# Patient Record
Sex: Male | Born: 1955 | Race: White | Hispanic: No | Marital: Married | Smoking: Former smoker
Health system: Southern US, Community
[De-identification: ages and names within clinical notes are randomized; demographics above are authoritative.]

## PROBLEM LIST (undated history)

## (undated) DIAGNOSIS — M109 Gout, unspecified: Secondary | ICD-10-CM

## (undated) DIAGNOSIS — B019 Varicella without complication: Secondary | ICD-10-CM

## (undated) DIAGNOSIS — Z8371 Family history of colonic polyps: Secondary | ICD-10-CM

## (undated) DIAGNOSIS — J45909 Unspecified asthma, uncomplicated: Secondary | ICD-10-CM

## (undated) DIAGNOSIS — R55 Syncope and collapse: Secondary | ICD-10-CM

## (undated) DIAGNOSIS — Z83719 Family history of colon polyps, unspecified: Secondary | ICD-10-CM

## (undated) DIAGNOSIS — R9439 Abnormal result of other cardiovascular function study: Secondary | ICD-10-CM

## (undated) DIAGNOSIS — E785 Hyperlipidemia, unspecified: Secondary | ICD-10-CM

## (undated) HISTORY — PX: ORIF ANKLE FRACTURE: SUR919

## (undated) HISTORY — DX: Hyperlipidemia, unspecified: E78.5

## (undated) HISTORY — DX: Family history of colon polyps, unspecified: Z83.719

## (undated) HISTORY — PX: WISDOM TOOTH EXTRACTION: SHX21

## (undated) HISTORY — PX: SIGMOIDOSCOPY: SUR1295

## (undated) HISTORY — DX: Syncope and collapse: R55

## (undated) HISTORY — DX: Abnormal result of other cardiovascular function study: R94.39

## (undated) HISTORY — DX: Family history of colonic polyps: Z83.71

## (undated) HISTORY — DX: Unspecified asthma, uncomplicated: J45.909

## (undated) HISTORY — PX: SKIN GRAFT SPLIT THICKNESS LEG / FOOT: SUR1303

## (undated) HISTORY — PX: TONSILLECTOMY: SUR1361

## (undated) HISTORY — DX: Gout, unspecified: M10.9

## (undated) HISTORY — DX: Varicella without complication: B01.9

## (undated) HISTORY — PX: ANTERIOR CRUCIATE LIGAMENT REPAIR: SHX115

---

## 1999-08-26 ENCOUNTER — Emergency Department (HOSPITAL_COMMUNITY): Admission: EM | Admit: 1999-08-26 | Discharge: 1999-08-26 | Payer: Self-pay | Admitting: Emergency Medicine

## 2005-06-17 ENCOUNTER — Ambulatory Visit: Payer: Self-pay | Admitting: Internal Medicine

## 2005-07-29 ENCOUNTER — Ambulatory Visit: Payer: Self-pay | Admitting: Internal Medicine

## 2005-07-30 ENCOUNTER — Ambulatory Visit: Payer: Self-pay | Admitting: Internal Medicine

## 2006-11-22 ENCOUNTER — Ambulatory Visit: Payer: Self-pay | Admitting: Internal Medicine

## 2006-11-29 ENCOUNTER — Ambulatory Visit: Payer: Self-pay | Admitting: Internal Medicine

## 2007-03-01 ENCOUNTER — Encounter: Payer: Self-pay | Admitting: Internal Medicine

## 2007-04-22 ENCOUNTER — Telehealth (INDEPENDENT_AMBULATORY_CARE_PROVIDER_SITE_OTHER): Payer: Self-pay | Admitting: *Deleted

## 2007-05-27 ENCOUNTER — Ambulatory Visit: Payer: Self-pay | Admitting: Internal Medicine

## 2007-05-27 DIAGNOSIS — E782 Mixed hyperlipidemia: Secondary | ICD-10-CM | POA: Insufficient documentation

## 2007-05-27 HISTORY — DX: Mixed hyperlipidemia: E78.2

## 2007-05-27 LAB — CONVERTED CEMR LAB
Cholesterol, target level: 200 mg/dL
HDL goal, serum: 40 mg/dL
LDL Goal: 160 mg/dL

## 2007-08-01 ENCOUNTER — Ambulatory Visit: Payer: Self-pay | Admitting: Internal Medicine

## 2007-08-15 ENCOUNTER — Encounter (INDEPENDENT_AMBULATORY_CARE_PROVIDER_SITE_OTHER): Payer: Self-pay | Admitting: *Deleted

## 2007-11-24 ENCOUNTER — Ambulatory Visit: Payer: Self-pay | Admitting: Internal Medicine

## 2007-12-06 ENCOUNTER — Ambulatory Visit: Payer: Self-pay | Admitting: Internal Medicine

## 2007-12-06 DIAGNOSIS — M109 Gout, unspecified: Secondary | ICD-10-CM | POA: Insufficient documentation

## 2007-12-06 DIAGNOSIS — J45909 Unspecified asthma, uncomplicated: Secondary | ICD-10-CM

## 2007-12-06 LAB — CONVERTED CEMR LAB
AST: 21 units/L (ref 0–37)
Bilirubin, Direct: 0.2 mg/dL (ref 0.0–0.3)
Cholesterol: 202 mg/dL (ref 0–200)
HDL: 37.7 mg/dL — ABNORMAL LOW (ref >39.0)
Total Bilirubin: 1.1 mg/dL (ref 0.3–1.2)
Total CK: 88 units/L (ref 7–195)

## 2007-12-07 ENCOUNTER — Encounter (INDEPENDENT_AMBULATORY_CARE_PROVIDER_SITE_OTHER): Payer: Self-pay | Admitting: *Deleted

## 2008-01-04 ENCOUNTER — Ambulatory Visit: Payer: Self-pay | Admitting: Internal Medicine

## 2008-01-04 LAB — CONVERTED CEMR LAB
AST: 26 units/L (ref 0–37)
Alkaline Phosphatase: 55 units/L (ref 39–117)
Direct LDL: 144.7 mg/dL
Total Bilirubin: 1.4 mg/dL — ABNORMAL HIGH (ref 0.3–1.2)
Total CHOL/HDL Ratio: 6.8

## 2008-01-05 ENCOUNTER — Encounter: Payer: Self-pay | Admitting: Internal Medicine

## 2008-01-06 ENCOUNTER — Encounter (INDEPENDENT_AMBULATORY_CARE_PROVIDER_SITE_OTHER): Payer: Self-pay | Admitting: *Deleted

## 2008-02-29 ENCOUNTER — Telehealth (INDEPENDENT_AMBULATORY_CARE_PROVIDER_SITE_OTHER): Payer: Self-pay | Admitting: *Deleted

## 2008-03-01 ENCOUNTER — Telehealth (INDEPENDENT_AMBULATORY_CARE_PROVIDER_SITE_OTHER): Payer: Self-pay | Admitting: *Deleted

## 2008-05-18 ENCOUNTER — Ambulatory Visit: Payer: Self-pay | Admitting: Internal Medicine

## 2008-05-31 ENCOUNTER — Telehealth (INDEPENDENT_AMBULATORY_CARE_PROVIDER_SITE_OTHER): Payer: Self-pay | Admitting: *Deleted

## 2008-06-01 LAB — CONVERTED CEMR LAB
ALT: 30 units/L (ref 0–53)
Albumin: 4.1 g/dL (ref 3.5–5.2)
HDL: 33.9 mg/dL — ABNORMAL LOW (ref >39.0)
Total Bilirubin: 0.9 mg/dL (ref 0.3–1.2)
VLDL: 16 mg/dL (ref 0–40)

## 2008-08-07 ENCOUNTER — Ambulatory Visit: Payer: Self-pay | Admitting: Internal Medicine

## 2008-08-16 ENCOUNTER — Ambulatory Visit: Payer: Self-pay | Admitting: Internal Medicine

## 2008-08-16 LAB — CONVERTED CEMR LAB
OCCULT 1: NEGATIVE
OCCULT 2: NEGATIVE

## 2008-08-20 ENCOUNTER — Encounter (INDEPENDENT_AMBULATORY_CARE_PROVIDER_SITE_OTHER): Payer: Self-pay | Admitting: *Deleted

## 2008-08-27 ENCOUNTER — Telehealth (INDEPENDENT_AMBULATORY_CARE_PROVIDER_SITE_OTHER): Payer: Self-pay | Admitting: *Deleted

## 2009-01-23 ENCOUNTER — Ambulatory Visit: Payer: Self-pay | Admitting: Internal Medicine

## 2009-01-27 LAB — CONVERTED CEMR LAB
AST: 31 units/L (ref 0–37)
Albumin: 3.8 g/dL (ref 3.5–5.2)
Alkaline Phosphatase: 52 units/L (ref 39–117)
Bilirubin, Direct: 0.1 mg/dL (ref 0.0–0.3)
Total Bilirubin: 1.1 mg/dL (ref 0.3–1.2)
Total CHOL/HDL Ratio: 3
Triglycerides: 96 mg/dL (ref 0.0–149.0)

## 2009-01-28 ENCOUNTER — Encounter (INDEPENDENT_AMBULATORY_CARE_PROVIDER_SITE_OTHER): Payer: Self-pay | Admitting: *Deleted

## 2009-02-01 ENCOUNTER — Telehealth (INDEPENDENT_AMBULATORY_CARE_PROVIDER_SITE_OTHER): Payer: Self-pay | Admitting: *Deleted

## 2009-02-06 ENCOUNTER — Encounter: Payer: Self-pay | Admitting: Internal Medicine

## 2009-02-11 ENCOUNTER — Encounter: Payer: Self-pay | Admitting: Internal Medicine

## 2009-08-17 HISTORY — PX: COLONOSCOPY: SHX174

## 2010-03-11 ENCOUNTER — Ambulatory Visit: Payer: Self-pay | Admitting: Internal Medicine

## 2010-03-11 DIAGNOSIS — R7989 Other specified abnormal findings of blood chemistry: Secondary | ICD-10-CM | POA: Insufficient documentation

## 2010-08-17 DIAGNOSIS — R55 Syncope and collapse: Secondary | ICD-10-CM | POA: Insufficient documentation

## 2010-08-17 HISTORY — DX: Syncope and collapse: R55

## 2010-09-16 NOTE — Assessment & Plan Note (Signed)
Summary: NEEDS CRESTOR REFILL, NEEDS TO DISCUSS LABS FROM ANOTHER DOCT...   Vital Signs:  Patient profile:   55 year old male Weight:      229.2 pounds Pulse rate:   72 / minute Resp:     13 per minute BP sitting:   118 / 72  (left arm) Cuff size:   large  Vitals Entered By: Shonna Chock CMA (March 11, 2010 4:25 PM) CC: 1.) Refill Crestor and discuss lab (drawn by another doctor)  2.) Personal Concerns, Lipid Management   CC:  1.) Refill Crestor and discuss lab (drawn by another doctor)  2.) Personal Concerns and Lipid Management.  History of Present Illness: Hyperlipidemia Follow-Up      This is a 55 year old man who presents for Hyperlipidemia follow-up.  The patient reports dietary related loose  or diarrheal stools, but denies persitant  muscle aches, GI upset, abdominal pain, flushing, itching, constipation, and fatigue.  The patient denies the following symptoms: chest pain/pressure, exercise intolerance, dypsnea, palpitations, syncope, and pedal edema.  Compliance with medications (by patient report) has been near 100%.  Dietary compliance has been good.  The patient reports no exercise.  Adjunctive measures currently used by the patient include ASA.   Lipids 02/06/2010 : TC 167, LDL 100, HDL 45, & TG 112 on Crestor 20 mg once daily .   Lipid Management History:      Positive NCEP/ATP III risk factors include male age 18 years old or older.  Negative NCEP/ATP III risk factors include non-diabetic, no family history for ischemic heart disease, non-tobacco-user status, non-hypertensive, no ASHD (atherosclerotic heart disease), no prior stroke/TIA, no peripheral vascular disease, and no history of aortic aneurysm.     Current Medications (verified): 1)  Claritin 10 Mg  Tabs (Loratadine) .Marland Kitchen.. 1 Once Daily As Needed Allergies 2)  Colchicine 0.6 Mg  Tabs (Colchicine) .... Take As Directed 3)  Crestor 20 Mg Tabs (Rosuvastatin Calcium) .Marland Kitchen.. 1 Qd 4)  Singulair 10 Mg Tabs (Montelukast  Sodium) .Marland Kitchen.. 1 By Mouth Once Daily As Needed  Allergies: 1)  ! * Indomethacin  Past History:  Past Medical History: ASTHMA (ICD-493.90) HYPERLIPIDEMIA (ICD-272.4): NMR 2006: LDL 162(2555/1850), HDL 31, TG 151.LDL goal = < 90. Framingham Study LDL goal = < 160.  Family History: PGM :CVA in 76s Father: lung cancer Mother: colon polyps,lipids,?DM Siblings:  JYN:WGNFA polyp,gout;no FH Hemochromatosis   Social History: no diet , restricts red meat as to serving size Never Smoked  Review of Systems CV:  Denies difficulty breathing at night and difficulty breathing while lying down. Resp:  Denies cough, shortness of breath, sputum productive, and wheezing; rescue inhaler last used 3 months ago. MS:  Denies joint pain, joint redness, and joint swelling; Uric acid 7.4. Derm:  Complains of rash; Penile rash after hiking w/o underwear , responsive to topical antifungal. Heme:  Denies abnormal bruising and bleeding; Serum Iron 160 ( < 155), iron saturation 56 (< 55).  Physical Exam  General:  well-nourished; alert,appropriate and cooperative throughout examination Eyes:  No corneal or conjunctival inflammation noted.No icterus Neck:  No deformities, masses, or tenderness noted. Lungs:  Normal respiratory effort, chest expands symmetrically. Lungs are clear to auscultation, no crackles or wheezes. Heart:  Normal rate and regular rhythm. S1 and S2 normal without gallop, murmur, click, rub or other extra sounds. Abdomen:  Bowel sounds positive,abdomen soft and non-tender without masses, organomegaly or hernias noted. Genitalia:   No penis lesions or urethral discharge. Pulses:  R  and L carotid,radial,dorsalis pedis and posterior tibial pulses are full and equal bilaterally Extremities:  No clubbing, cyanosis, edema. Neurologic:  alert & oriented X3 and DTRs symmetrical and 0-1/2 + @ knees     Impression & Recommendations:  Problem # 1:  HYPERLIPIDEMIA (ICD-272.2) Lipids @ goal His  updated medication list for this problem includes:    Crestor 20 Mg Tabs (Rosuvastatin calcium) .Marland Kitchen... 1 qd  Problem # 2:  ASTHMA (ICD-493.90) Quiescent His updated medication list for this problem includes:    Singulair 10 Mg Tabs (Montelukast sodium) .Marland Kitchen... 1 by mouth once daily as needed    Ventolin Hfa 108 (90 Base) Mcg/act Aers (Albuterol sulfate) .Marland Kitchen... 1-2 puffs ever 4 hrs as needed  Problem # 3:  OTHER ABNORMAL BLOOD CHEMISTRY (ICD-790.6) Minimally elevated iron stores  Complete Medication List: 1)  Claritin 10 Mg Tabs (Loratadine) .Marland Kitchen.. 1 once daily as needed allergies 2)  Colchicine 0.6 Mg Tabs (Colchicine) .... Take as directed 3)  Crestor 20 Mg Tabs (Rosuvastatin calcium) .Marland Kitchen.. 1 qd 4)  Singulair 10 Mg Tabs (Montelukast sodium) .Marland Kitchen.. 1 by mouth once daily as needed 5)  Ventolin Hfa 108 (90 Base) Mcg/act Aers (Albuterol sulfate) .Marland Kitchen.. 1-2 puffs ever 4 hrs as needed  Lipid Assessment/Plan:      Based on NCEP/ATP III, the patient's risk factor category is "0-1 risk factors".  The patient's lipid goals are as follows: Total cholesterol goal is 200; LDL cholesterol goal is 160; HDL cholesterol goal is 40; Triglyceride goal is 150.  His LDL cholesterol goal has not been met.  Secondary causes for hyperlipidemia have been ruled out.  He has been counseled on adjunctive measures for lowering his cholesterol and has been provided with dietary instructions.    Patient Instructions: 1)  Avoid vitamins with iron & excess red meat. Monitor iron stores annually. Prescriptions: VENTOLIN HFA 108 (90 BASE) MCG/ACT AERS (ALBUTEROL SULFATE) 1-2 puffs ever 4 hrs as needed  #1 x 2   Entered and Authorized by:   Marga Melnick MD   Signed by:   Marga Melnick MD on 03/11/2010   Method used:   Print then Give to Patient   RxID:   [id number] SINGULAIR 10 MG TABS (MONTELUKAST SODIUM) 1 by mouth once daily as needed  #90 x 3   Entered and Authorized by:   Marga Melnick MD   Signed by:   Marga Melnick MD on 03/11/2010   Method used:   Print then Give to Patient   RxID:   [id number] CRESTOR 20 MG TABS (ROSUVASTATIN CALCIUM) 1 qd  #90 x 3   Entered and Authorized by:   Marga Melnick MD   Signed by:   Marga Melnick MD on 03/11/2010   Method used:   Print then Give to Patient   RxID:   [id number]

## 2011-05-22 ENCOUNTER — Encounter: Payer: Self-pay | Admitting: Internal Medicine

## 2011-05-25 ENCOUNTER — Other Ambulatory Visit: Payer: Self-pay | Admitting: Internal Medicine

## 2011-05-25 ENCOUNTER — Ambulatory Visit (INDEPENDENT_AMBULATORY_CARE_PROVIDER_SITE_OTHER): Payer: Managed Care, Other (non HMO) | Admitting: Internal Medicine

## 2011-05-25 ENCOUNTER — Encounter: Payer: Self-pay | Admitting: Internal Medicine

## 2011-05-25 DIAGNOSIS — E782 Mixed hyperlipidemia: Secondary | ICD-10-CM

## 2011-05-25 DIAGNOSIS — M109 Gout, unspecified: Secondary | ICD-10-CM

## 2011-05-25 DIAGNOSIS — R3129 Other microscopic hematuria: Secondary | ICD-10-CM

## 2011-05-25 DIAGNOSIS — J45909 Unspecified asthma, uncomplicated: Secondary | ICD-10-CM

## 2011-05-25 DIAGNOSIS — M791 Myalgia, unspecified site: Secondary | ICD-10-CM

## 2011-05-25 DIAGNOSIS — IMO0001 Reserved for inherently not codable concepts without codable children: Secondary | ICD-10-CM

## 2011-05-25 LAB — POCT URINALYSIS DIPSTICK
Bilirubin, UA: NEGATIVE
Glucose, UA: NEGATIVE
Ketones, UA: NEGATIVE
Leukocytes, UA: NEGATIVE
Nitrite, UA: NEGATIVE

## 2011-05-25 LAB — CK: Total CK: 75 U/L (ref 7–232)

## 2011-05-25 MED ORDER — ALLOPURINOL 100 MG PO TABS: 100.0000 mg | ORAL_TABLET | Freq: Every day | ORAL | Status: DC

## 2011-05-25 NOTE — Progress Notes (Signed)
Subjective:    Patient ID: David Melendez, male    DOB: [birth year], 55 y.o.   MRN: [id number]  HPI Asthma: Triggers:dust Cough:no; occasional wheezing Sputum production:only with dustno Dyspnea (rest/exertional/PND): Chest pain, edema, palpitations:no Treatment/efficacy:see meds Use of rescue inhaler:averages 1X/week Use of maintenance inhaler:no; Advair  irritatedthroat Smoking:quit in his 47s Past medical history: seasonal  Allergies, good control with Singulair Family history pulmonary disease: no  HYPERLIPIDEMIA: Medications: Compliance- yes , Crestor 20 mg daily  Lightheadedness,Syncope-  4 months ago he experienced syncope when he stood up to go to the bathroom on the airplane. At that time he was wearing a "hoodie" and become overheated. He was evaluated by a EMS. BP was low @ that time, but returned to normal. 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 His LDL was  82; HDL 37 and triglycerides 103. Urine revealed 0-3 red cells. Uric acid was 7.6. It has ranged from 6.3  to  this high. PSA was 2.0        Review of Systems he denies hematuria, dysuria or pyuria.  He has not had a gout attack for at least 24 months. The last was in the setting of a fractured ankle.     Objective:   Physical Exam Gen.: Healthy and well-nourished in appearance. Alert, appropriate and cooperative throughout exam. Head: Normocephalic without obvious abnormalities  Eyes: No corneal or conjunctival inflammation noted. Pupils equal round reactive to light and accommodation. Fundal exam is benign without hemorrhages, exudate, papilledema. Neck: No deformities, masses, or tenderness noted.  Thyroid normal. Lungs: Normal respiratory effort; chest expands symmetrically. Lungs are clear to auscultation without rales, wheezes, or increased work of breathing. Heart: Normal rate and rhythm. Normal S1  and S2. No gallop, click, or rub. No murmur. Abdomen: Bowel sounds normal; abdomen soft and nontender. No masses, organomegaly or hernias noted.                                                                                 Musculoskeletal/extremities: No deformity or scoliosis noted of  the thoracic or lumbar spine. No clubbing, cyanosis, edema, or deformity noted. Nail health  good. Vascular: Carotid, radial artery, dorsalis pedis and  posterior tibial pulses are full and equal. No bruits present. Neurologic: Alert and oriented x3. Deep tendon reflexes symmetrical and 0-1/2 +.          Skin: Intact without suspicious lesions or rashes. Lymph: No cervical, axillary lymphadenopathy present. Psych: Mood and affect are normal. Normally interactive                                                                                         Assessment & Plan:  #1 asthma, excellent control.  #2 dyslipidemia,  only HDL not at goal  #3 myalgias, probably work related  #4 past medical history of gout; no recent recurrences despite a uric acid of 7.6   Plan: see Orders

## 2011-05-25 NOTE — Patient Instructions (Signed)
The most common cause of elevated triglycerides is the ingestion of sugar from high fructose corn syrup sources. You should consume less than 40 grams  of sugar per day from foods and drinks with high fructose corn syrup as number 2, 3, or #4 on the label. As TG go up, HDL or good cholesterol goes down. Also uric acid which causes gout will go up.    Interventions to raise HDL or GOOD cholesterol include: exercising 30-45 minutes 3-4 X per week; including salmon & tuna in the diet;  & supplementing with Omega 3 fatty acids (Flax or Fish oil )  1-2 grams per day. The B vitamin Niacin also raises HDL but has a vasodilating effect which may cause flushing.

## 2011-05-26 ENCOUNTER — Other Ambulatory Visit: Payer: Self-pay | Admitting: Internal Medicine

## 2011-08-18 DIAGNOSIS — R9439 Abnormal result of other cardiovascular function study: Secondary | ICD-10-CM

## 2011-08-18 HISTORY — DX: Abnormal result of other cardiovascular function study: R94.39

## 2011-08-26 ENCOUNTER — Other Ambulatory Visit: Payer: Self-pay | Admitting: Internal Medicine

## 2011-12-20 ENCOUNTER — Other Ambulatory Visit: Payer: Self-pay | Admitting: Internal Medicine

## 2011-12-21 NOTE — Telephone Encounter (Signed)
Patient needs to schedule a CPX  

## 2012-01-12 DIAGNOSIS — K5790 Diverticulosis of intestine, part unspecified, without perforation or abscess without bleeding: Secondary | ICD-10-CM

## 2012-01-12 HISTORY — DX: Diverticulosis of intestine, part unspecified, without perforation or abscess without bleeding: K57.90

## 2012-02-01 DIAGNOSIS — Z860101 Personal history of adenomatous and serrated colon polyps: Secondary | ICD-10-CM

## 2012-02-01 DIAGNOSIS — Z86010 Personal history of colonic polyps: Secondary | ICD-10-CM

## 2012-02-01 HISTORY — DX: Personal history of colonic polyps: Z86.010

## 2012-02-01 HISTORY — DX: Personal history of adenomatous and serrated colon polyps: Z86.0101

## 2012-02-05 ENCOUNTER — Encounter: Payer: Self-pay | Admitting: Internal Medicine

## 2012-02-05 ENCOUNTER — Ambulatory Visit (INDEPENDENT_AMBULATORY_CARE_PROVIDER_SITE_OTHER): Payer: Managed Care, Other (non HMO) | Admitting: Internal Medicine

## 2012-02-05 VITALS — BP 128/80 | HR 84 | Wt 226.8 lb

## 2012-02-05 DIAGNOSIS — J45909 Unspecified asthma, uncomplicated: Secondary | ICD-10-CM

## 2012-02-05 DIAGNOSIS — E782 Mixed hyperlipidemia: Secondary | ICD-10-CM

## 2012-02-05 DIAGNOSIS — M109 Gout, unspecified: Secondary | ICD-10-CM

## 2012-02-05 MED ORDER — MONTELUKAST SODIUM 10 MG PO TABS: 10.0000 mg | ORAL_TABLET | Freq: Every day | ORAL | Status: DC

## 2012-02-05 MED ORDER — ROSUVASTATIN CALCIUM 20 MG PO TABS: 20.0000 mg | ORAL_TABLET | Freq: Every day | ORAL | Status: DC

## 2012-02-05 NOTE — Progress Notes (Signed)
Subjective:    Patient ID: David Melendez, male    DOB: Aug 31, 1955, 56 y.o.   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 Extensive labs were performed; these are pending. Apparently there was some abnormality on his stress test. A followup stress echo will be pursued on 02/08/12. He has not had chest pain, palpitations, claudication, or edema. There is some localized swelling related to plantar fasciitis. He states that since the stress test he has had intermittent sensation of incomplete breath.  His asthma is well controlled on generic Singulair. He uses his rescue inhaler on average once a month. The past history of asthma and discussion of  triggers is in the problem list. There is some exercise-induced bronchospasm component to his asthma as biking can initiate some symptoms.  His gout is quiescent. He states he takes colchicine and allopurinol on as-needed basis.   Dyslipidemia assessment: Prior Advanced Lipid Testing: NMR ; LDL goal = < 100, ideally.   Family history of premature CAD/ MI: P uncle in 69s .  Nutrition: no plan .  Exercise: no regular program   . Diabetes :no . HTN:no. Smoking history  : in 104s X 2 years .   Weight :  stable.     Review of Systems ROS: fatigue: not significant ;  abd pain/bowel changes: no ; myalgias:no;  syncope : last year on plane due to being overheated with hypotension ; memory loss: no;skin changes:no.        Objective:   Physical Exam Gen.: Healthy and well-nourished in appearance. Alert, appropriate and cooperative throughout exam. Head: Normocephalic without obvious abnormalities  Eyes: No corneal or conjunctival inflammation noted. Lid lag suggested. Extraocular motion intact.  Ears: External  ear exam reveals no significant lesions or deformities. Canals clear .TMs normal.  Nose: External nasal exam reveals no deformity or inflammation. Nasal mucosa are pink and moist. No lesions or exudates noted.  Septum to R Mouth: Oral mucosa and oropharynx reveal no lesions or exudates. Teeth in good repair. Neck: No deformities, masses, or tenderness noted. Thyroid normal Lungs: Normal respiratory effort; chest expands symmetrically. Lungs are clear to auscultation without rales, wheezes, or increased work of breathing. Heart: Normal rate and rhythm. Normal S1 and S2. No gallop, click, or rub.S4 w/o murmur. Abdomen: Bowel sounds normal; abdomen soft and nontender. No masses, organomegaly or hernias noted. Genitalia/DRE: colonoscopy to be scheduled                                                                            Musculoskeletal/extremities: No deformity or scoliosis noted of  the thoracic or lumbar spine; but there is some asymmetry of the posterior thoracic musculature suggesting occult scoliosis. . No clubbing, cyanosis, edema, or deformity noted. Range of motion  normal .Tone & strength  normal.Joints normal. Nail health  good. Vascular: Carotid, radial artery, dorsalis pedis and  posterior tibial pulses are full and equal. No bruits present. Neurologic: Alert and oriented x3. Deep tendon reflexes symmetrical and normal.          Skin: Intact without suspicious lesions or rashes. Lymph: No cervical, axillary lymphadenopathy present. Psych: Mood and affect are normal. Normally interactive  Assessment & Plan:

## 2012-02-05 NOTE — Assessment & Plan Note (Addendum)
His lipids and hepatic panel will need to be reviewed to allow long-term renewal of the statin. Samples will be provided until those results are can be reviewed.  He was informed glucosamine can raise cholesterol in some individuals

## 2012-02-05 NOTE — Patient Instructions (Addendum)
To prevent gout the minimal uric acid goal is < 7; preferred is < 6, ideally < 5 to prevent gout.  The most common cause of elevated uric acid is the ingestion of sugar from high fructose corn syrup sources. You should consume less than 40 grams  of sugar per day from foods and drinks with high fructose corn syrup as number 1, 2, 3, or #4 on the label. Please FAX  lab results to  [phone]

## 2012-02-05 NOTE — Assessment & Plan Note (Addendum)
By history and exam, asthma is well controlled. No change in regimen recommended. I question the possibility of a false positive stress test  due to exercise-induced bronchospasm. He should discuss this with the technician. Perhaps preprocedure albuterol may be of benefit in defining risk

## 2012-02-05 NOTE — Assessment & Plan Note (Addendum)
Gallop is quiescent. He should not take allopurinol for acute attacks. He should avoid high fructose corn syrup sugar as this is the #1 cause of uric acid elevation. Uric acid level should be monitored.  If colchicine is taken for acute gout; his Crestor should be held during that period while on colchicine

## 2012-02-08 DIAGNOSIS — Z8249 Family history of ischemic heart disease and other diseases of the circulatory system: Secondary | ICD-10-CM | POA: Insufficient documentation

## 2012-02-08 DIAGNOSIS — R9439 Abnormal result of other cardiovascular function study: Secondary | ICD-10-CM

## 2012-02-08 HISTORY — DX: Family history of ischemic heart disease and other diseases of the circulatory system: Z82.49

## 2012-02-08 HISTORY — DX: Abnormal result of other cardiovascular function study: R94.39

## 2012-04-26 ENCOUNTER — Other Ambulatory Visit: Payer: Self-pay | Admitting: Internal Medicine

## 2012-04-26 DIAGNOSIS — J45909 Unspecified asthma, uncomplicated: Secondary | ICD-10-CM

## 2012-04-26 DIAGNOSIS — E782 Mixed hyperlipidemia: Secondary | ICD-10-CM

## 2012-04-26 MED ORDER — ROSUVASTATIN CALCIUM 20 MG PO TABS: 20.0000 mg | ORAL_TABLET | Freq: Every day | ORAL | Status: DC

## 2012-04-26 MED ORDER — ALBUTEROL SULFATE HFA 108 (90 BASE) MCG/ACT IN AERS: INHALATION_SPRAY | RESPIRATORY_TRACT | Status: DC

## 2012-04-26 MED ORDER — MONTELUKAST SODIUM 10 MG PO TABS: 10.0000 mg | ORAL_TABLET | Freq: Every day | ORAL | Status: DC

## 2012-04-26 NOTE — Telephone Encounter (Signed)
Rx's sent in. °

## 2012-04-26 NOTE — Telephone Encounter (Signed)
Patient called wanting refills, states he was refused refills at his appt 6.21.13 until he brought in his labs. He stated he brought in to front desk and is now in needs of the following & is requesting a 1-yrs supply be sent to [facility] on [address]  1-crestor 2-montelukast 3-inhaler  Cb# [phone]

## 2013-03-22 ENCOUNTER — Other Ambulatory Visit: Payer: Self-pay | Admitting: Internal Medicine

## 2013-03-23 NOTE — Telephone Encounter (Signed)
Pending appointment coming up 03/2013

## 2013-04-05 ENCOUNTER — Ambulatory Visit (INDEPENDENT_AMBULATORY_CARE_PROVIDER_SITE_OTHER): Payer: Managed Care, Other (non HMO) | Admitting: Internal Medicine

## 2013-04-05 ENCOUNTER — Encounter: Payer: Self-pay | Admitting: Internal Medicine

## 2013-04-05 VITALS — BP 136/80 | HR 66 | Temp 98.3°F | Resp 12 | Ht 71.5 in | Wt 224.0 lb

## 2013-04-05 DIAGNOSIS — E559 Vitamin D deficiency, unspecified: Secondary | ICD-10-CM

## 2013-04-05 DIAGNOSIS — E782 Mixed hyperlipidemia: Secondary | ICD-10-CM

## 2013-04-05 DIAGNOSIS — Z0000 Encounter for general adult medical examination without abnormal findings: Secondary | ICD-10-CM

## 2013-04-05 DIAGNOSIS — Z1331 Encounter for screening for depression: Secondary | ICD-10-CM

## 2013-04-05 HISTORY — DX: Vitamin D deficiency, unspecified: E55.9

## 2013-04-05 MED ORDER — ROSUVASTATIN CALCIUM 20 MG PO TABS: 20.0000 mg | ORAL_TABLET | Freq: Every day | ORAL | Status: DC

## 2013-04-05 MED ORDER — MONTELUKAST SODIUM 10 MG PO TABS: ORAL_TABLET | ORAL | Status: DC

## 2013-04-05 MED ORDER — ALLOPURINOL 100 MG PO TABS: 100.0000 mg | ORAL_TABLET | Freq: Every day | ORAL | Status: DC

## 2013-04-05 NOTE — Patient Instructions (Addendum)
If you activate the  My Chart system; lab & Xray results will be released directly  to you as soon as I review & address these through the computer. If you choose not to sign up for My Chart within 36 hours of labs being drawn; results will be reviewed & interpretation added before being copied & mailed, causing a delay in getting the results to you.If you do not receive that report within 7-10 days ,please call. Additionally you can use this system to gain direct  access to your records  if  out of town or @ an office of a  physician who is not in  the My Chart network.  This improves continuity of care & places you in control of your medical record.  Please sign a release of records  for data related to  Colonoscopy findings

## 2013-04-05 NOTE — Progress Notes (Signed)
  Subjective:    Patient ID: David Melendez, male    DOB: 1956-08-03, 57 y.o.   MRN: [id number]  HPI  He  is here for a physical;acute issues denied.     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 Specifically he denies chest pain, palpitations, dyspnea, or claudication.  Family history is negative for premature coronary disease. Advanced cholesterol testing reveals his LDL goal is less than 100. There is medication compliance with the statin. Significant abdominal symptoms, memory deficit, or myalgias denied.     Objective:   Physical Exam Gen.: Healthy and well-nourished in appearance. Alert, appropriate and cooperative throughout exam.Appears younger than stated age  Head: Normocephalic without obvious abnormalities; thinning over crown  Eyes: No corneal or conjunctival inflammation noted. Pupils equal round reactive to light and accommodation.  Extraocular motion intact. Vision grossly normal without lenses Ears: External  ear exam reveals no significant lesions or deformities. Canals clear .TMs normal. Hearing is grossly normal bilaterally. Nose: External nasal exam reveals no deformity or inflammation. Nasal mucosa are pink and moist. No lesions or exudates noted. Septum deviated to R Mouth: Oral mucosa and oropharynx reveal no lesions or exudates. Teeth in good repair. Neck: No deformities, masses, or tenderness noted. Range of motion & Thyroid normal. Lungs: Normal respiratory effort; chest expands symmetrically. Lungs are clear to auscultation without rales, wheezes, or increased work of breathing. Heart: Normal rate and rhythm. Normal S1 and S2. No gallop, click, or rub. S4 w/o murmur. Abdomen: Bowel sounds normal; abdomen soft and nontender. No masses, organomegaly or hernias noted. Genitalia: Genitalia normal except for left varices. Prostate is normal without enlargement,  asymmetry, nodularity, or induration.                     Musculoskeletal/extremities: No deformity or scoliosis noted of  the thoracic or lumbar spine.  No clubbing, cyanosis, edema, or significant extremity  deformity noted. Range of motion normal .Tone & strength  Normal. Joints normal. Nail health good. Able to lie down & sit up w/o help. Negative SLR bilaterally Vascular: Carotid, radial artery, dorsalis pedis and  posterior tibial pulses are full and equal. No bruits present. Neurologic: Alert and oriented x3. Deep tendon reflexes symmetrical and normal.         Skin: Intact without suspicious lesions or rashes. Bruise R thigh Lymph: No cervical, axillary, or inguinal lymphadenopathy present. Psych: Mood and affect are normal. Normally interactive                                                                                        Assessment & Plan:  #1 comprehensive physical exam; no acute findings  Plan: see Orders  & Recommendations

## 2013-04-10 ENCOUNTER — Other Ambulatory Visit (INDEPENDENT_AMBULATORY_CARE_PROVIDER_SITE_OTHER): Payer: Managed Care, Other (non HMO)

## 2013-04-10 DIAGNOSIS — Z0000 Encounter for general adult medical examination without abnormal findings: Secondary | ICD-10-CM

## 2013-04-10 LAB — HEPATIC FUNCTION PANEL
ALT: 28 U/L (ref 0–53)
AST: 22 U/L (ref 0–37)
Bilirubin, Direct: 0 mg/dL (ref 0.0–0.3)
Total Bilirubin: 0.8 mg/dL (ref 0.3–1.2)
Total Protein: 7 g/dL (ref 6.0–8.3)

## 2013-04-10 LAB — LIPID PANEL
Cholesterol: 147 mg/dL (ref 0–200)
LDL Cholesterol: 76 mg/dL (ref 0–99)
Total CHOL/HDL Ratio: 4
Triglycerides: 148 mg/dL (ref 0.0–149.0)

## 2013-04-10 LAB — CBC WITH DIFFERENTIAL/PLATELET
Basophils Relative: 0.5 % (ref 0.0–3.0)
Eosinophils Relative: 1.2 % (ref 0.0–5.0)
HCT: 44.8 % (ref 39.0–52.0)
Lymphs Abs: 1.5 10*3/uL (ref 0.7–4.0)
MCV: 94.7 fl (ref 78.0–100.0)
Monocytes Absolute: 0.6 10*3/uL (ref 0.1–1.0)
Monocytes Relative: 11.4 % (ref 3.0–12.0)
Platelets: 237 10*3/uL (ref 150.0–400.0)
RBC: 4.73 Mil/uL (ref 4.22–5.81)
WBC: 5.6 10*3/uL (ref 4.5–10.5)

## 2013-04-10 LAB — BASIC METABOLIC PANEL
Chloride: 101 mEq/L (ref 96–112)
GFR: 93.91 mL/min (ref >60.00)
Potassium: 3.7 mEq/L (ref 3.5–5.1)
Sodium: 137 mEq/L (ref 135–145)

## 2013-04-10 LAB — URIC ACID: Uric Acid, Serum: 5 mg/dL (ref 4.0–7.8)

## 2013-04-12 ENCOUNTER — Encounter: Payer: Self-pay | Admitting: *Deleted

## 2013-04-14 ENCOUNTER — Encounter: Payer: Self-pay | Admitting: *Deleted

## 2013-04-14 LAB — VITAMIN D 1,25 DIHYDROXY
Vitamin D2 1, 25 (OH)2: <8 pg/mL
Vitamin D3 1, 25 (OH)2: 53 pg/mL

## 2013-04-23 ENCOUNTER — Encounter: Payer: Self-pay | Admitting: Internal Medicine

## 2013-04-23 DIAGNOSIS — D126 Benign neoplasm of colon, unspecified: Secondary | ICD-10-CM

## 2013-04-23 HISTORY — DX: Benign neoplasm of colon, unspecified: D12.6

## 2013-06-18 ENCOUNTER — Other Ambulatory Visit: Payer: Self-pay | Admitting: Internal Medicine

## 2013-06-19 NOTE — Telephone Encounter (Signed)
Montelukast refill sent to pharmacy 

## 2013-07-26 ENCOUNTER — Encounter: Payer: Self-pay | Admitting: Internal Medicine

## 2013-09-27 ENCOUNTER — Ambulatory Visit: Payer: Managed Care, Other (non HMO) | Admitting: Physician Assistant

## 2013-12-25 ENCOUNTER — Ambulatory Visit: Payer: Managed Care, Other (non HMO) | Admitting: Physician Assistant

## 2013-12-28 ENCOUNTER — Ambulatory Visit (INDEPENDENT_AMBULATORY_CARE_PROVIDER_SITE_OTHER): Payer: Managed Care, Other (non HMO) | Admitting: Physician Assistant

## 2013-12-28 ENCOUNTER — Encounter: Payer: Self-pay | Admitting: Physician Assistant

## 2013-12-28 VITALS — BP 124/88 | HR 67 | Temp 98.7°F | Resp 16 | Ht 71.5 in | Wt 221.2 lb

## 2013-12-28 DIAGNOSIS — M109 Gout, unspecified: Secondary | ICD-10-CM

## 2013-12-28 DIAGNOSIS — E782 Mixed hyperlipidemia: Secondary | ICD-10-CM

## 2013-12-28 DIAGNOSIS — Z0000 Encounter for general adult medical examination without abnormal findings: Secondary | ICD-10-CM

## 2013-12-28 DIAGNOSIS — Z23 Encounter for immunization: Secondary | ICD-10-CM

## 2013-12-28 DIAGNOSIS — J309 Allergic rhinitis, unspecified: Secondary | ICD-10-CM

## 2013-12-28 DIAGNOSIS — Z125 Encounter for screening for malignant neoplasm of prostate: Secondary | ICD-10-CM

## 2013-12-28 DIAGNOSIS — E785 Hyperlipidemia, unspecified: Secondary | ICD-10-CM

## 2013-12-28 HISTORY — DX: Encounter for screening for malignant neoplasm of prostate: Z12.5

## 2013-12-28 HISTORY — DX: Encounter for general adult medical examination without abnormal findings: Z00.00

## 2013-12-28 LAB — CBC WITH DIFFERENTIAL/PLATELET
Basophils Absolute: 0 10*3/uL (ref 0.0–0.1)
Basophils Relative: 1 % (ref 0–1)
EOS ABS: 0 10*3/uL (ref 0.0–0.7)
EOS PCT: 1 % (ref 0–5)
HCT: 46.2 % (ref 39.0–52.0)
Hemoglobin: 15.9 g/dL (ref 13.0–17.0)
LYMPHS ABS: 1.5 10*3/uL (ref 0.7–4.0)
Lymphocytes Relative: 34 % (ref 12–46)
MCH: 32.4 pg (ref 26.0–34.0)
MCHC: 34.4 g/dL (ref 30.0–36.0)
MCV: 94.3 fL (ref 78.0–100.0)
MONO ABS: 0.5 10*3/uL (ref 0.1–1.0)
Monocytes Relative: 12 % (ref 3–12)
Neutro Abs: 2.3 10*3/uL (ref 1.7–7.7)
Neutrophils Relative %: 52 % (ref 43–77)
PLATELETS: 228 10*3/uL (ref 150–400)
RBC: 4.9 MIL/uL (ref 4.22–5.81)
RDW: 12.7 % (ref 11.5–15.5)
WBC: 4.4 10*3/uL (ref 4.0–10.5)

## 2013-12-28 LAB — HEPATIC FUNCTION PANEL
ALBUMIN: 4.3 g/dL (ref 3.5–5.2)
ALT: 17 U/L (ref 0–53)
AST: 21 U/L (ref 0–37)
Alkaline Phosphatase: 46 U/L (ref 39–117)
BILIRUBIN TOTAL: 0.5 mg/dL (ref 0.2–1.2)
Bilirubin, Direct: 0.1 mg/dL (ref 0.0–0.3)
Indirect Bilirubin: 0.4 mg/dL (ref 0.2–1.2)
TOTAL PROTEIN: 6.5 g/dL (ref 6.0–8.3)

## 2013-12-28 LAB — BASIC METABOLIC PANEL WITH GFR
BUN: 13 mg/dL (ref 6–23)
CALCIUM: 9.5 mg/dL (ref 8.4–10.5)
CO2: 30 meq/L (ref 19–32)
CREATININE: 0.91 mg/dL (ref 0.50–1.35)
Chloride: 101 mEq/L (ref 96–112)
GFR, Est African American: >89 mL/min
GFR, Est Non African American: >89 mL/min
GLUCOSE: 89 mg/dL (ref 70–99)
Potassium: 4.7 mEq/L (ref 3.5–5.3)
Sodium: 139 mEq/L (ref 135–145)

## 2013-12-28 LAB — LIPID PANEL
Cholesterol: 135 mg/dL (ref 0–200)
HDL: 49 mg/dL (ref >39)
LDL Cholesterol: 73 mg/dL (ref 0–99)
TRIGLYCERIDES: 64 mg/dL (ref <150)
Total CHOL/HDL Ratio: 2.8 Ratio
VLDL: 13 mg/dL (ref 0–40)

## 2013-12-28 LAB — HEMOGLOBIN A1C
Hgb A1c MFr Bld: 5.6 % (ref <5.7)
Mean Plasma Glucose: 114 mg/dL (ref <117)

## 2013-12-28 MED ORDER — MONTELUKAST SODIUM 10 MG PO TABS: 10.0000 mg | ORAL_TABLET | Freq: Every day | ORAL | Status: DC

## 2013-12-28 MED ORDER — ALLOPURINOL 100 MG PO TABS: 100.0000 mg | ORAL_TABLET | Freq: Every day | ORAL | Status: DC

## 2013-12-28 MED ORDER — ROSUVASTATIN CALCIUM 20 MG PO TABS: 20.0000 mg | ORAL_TABLET | Freq: Every day | ORAL | Status: DC

## 2013-12-28 NOTE — Patient Instructions (Signed)
Please obtain labs. I will call you with your results.  Continue medications as directed.  I have sent in refills of your medications.  I have switched you back to branded Singulair.  We will schedule follow-up based on lab results.  Otherwise, return to clinic yearly for annual exam and as needed for sick visits.  Preventive Care for Adults, Male A healthy lifestyle and preventive care can promote health and wellness. Preventive health guidelines for men include the following key practices:  A routine yearly physical is a good way to check with your health care provider about your health and preventative screening. It is a chance to share any concerns and updates on your health and to receive a thorough exam.  Visit your dentist for a routine exam and preventative care every 6 months. Brush your teeth twice a day and floss once a day. Good oral hygiene prevents tooth decay and gum disease.  The frequency of eye exams is based on your age, health, family medical history, use of contact lenses, and other factors. Follow your health care provider's recommendations for frequency of eye exams.  Eat a healthy diet. Foods such as vegetables, fruits, whole grains, low-fat dairy products, and lean protein foods contain the nutrients you need without too many calories. Decrease your intake of foods high in solid fats, added sugars, and salt. Eat the right amount of calories for you.Get information about a proper diet from your health care provider, if necessary.  Regular physical exercise is one of the most important things you can do for your health. Most adults should get at least 150 minutes of moderate-intensity exercise (any activity that increases your heart rate and causes you to sweat) each week. In addition, most adults need muscle-strengthening exercises on 2 or more days a week.  Maintain a healthy weight. The body mass index (BMI) is a screening tool to identify possible weight problems. It  provides an estimate of body fat based on height and weight. Your health care provider can find your BMI and can help you achieve or maintain a healthy weight.For adults 20 years and older:  A BMI below 18.5 is considered underweight.  A BMI of 18.5 to 24.9 is normal.  A BMI of 25 to 29.9 is considered overweight.  A BMI of 30 and above is considered obese.  Maintain normal blood lipids and cholesterol levels by exercising and minimizing your intake of saturated fat. Eat a balanced diet with plenty of fruit and vegetables. Blood tests for lipids and cholesterol should begin at age 38 and be repeated every 5 years. If your lipid or cholesterol levels are high, you are over 50, or you are at high risk for heart disease, you may need your cholesterol levels checked more frequently.Ongoing high lipid and cholesterol levels should be treated with medicines if diet and exercise are not working.  If you smoke, find out from your health care provider how to quit. If you do not use tobacco, do not start.  Lung cancer screening is recommended for adults aged 78 80 years who are at high risk for developing lung cancer because of a history of smoking. A yearly low-dose CT scan of the lungs is recommended for people who have at least a 30-pack-year history of smoking and are a current smoker or have quit within the past 15 years. A pack year of smoking is smoking an average of 1 pack of cigarettes a day for 1 year (for example: 1 pack a  day for 30 years or 2 packs a day for 15 years). Yearly screening should continue until the smoker has stopped smoking for at least 15 years. Yearly screening should be stopped for people who develop a health problem that would prevent them from having lung cancer treatment.  If you choose to drink alcohol, do not have more than 2 drinks per day. One drink is considered to be 12 ounces (355 mL) of beer, 5 ounces (148 mL) of wine, or 1.5 ounces (44 mL) of liquor.  Avoid use of  street drugs. Do not share needles with anyone. Ask for help if you need support or instructions about stopping the use of drugs.  High blood pressure causes heart disease and increases the risk of stroke. Your blood pressure should be checked at least every 1 2 years. Ongoing high blood pressure should be treated with medicines, if weight loss and exercise are not effective.  If you are 38 57 years old, ask your health care provider if you should take aspirin to prevent heart disease.  Diabetes screening involves taking a blood sample to check your fasting blood sugar level. This should be done once every 3 years, after age 38, if you are within normal weight and without risk factors for diabetes. Testing should be considered at a younger age or be carried out more frequently if you are overweight and have at least 1 risk factor for diabetes.  Colorectal cancer can be detected and often prevented. Most routine colorectal cancer screening begins at the age of 31 and continues through age 46. However, your health care provider may recommend screening at an earlier age if you have risk factors for colon cancer. On a yearly basis, your health care provider may provide home test kits to check for hidden blood in the stool. Use of a small camera at the end of a tube to directly examine the colon (sigmoidoscopy or colonoscopy) can detect the earliest forms of colorectal cancer. Talk to your health care provider about this at age 14, when routine screening begins. Direct exam of the colon should be repeated every 5 10 years through age 32, unless early forms of precancerous polyps or small growths are found.  People who are at an increased risk for hepatitis B should be screened for this virus. You are considered at high risk for hepatitis B if:  You were born in a country where hepatitis B occurs often. Talk with your health care provider about which countries are considered high-risk.  Your parents were  born in a high-risk country and you have not received a shot to protect against hepatitis B (hepatitis B vaccine).  You have HIV or AIDS.  You use needles to inject street drugs.  You live with, or have sex with, someone who has hepatitis B.  You are a man who has sex with other men (MSM).  You get hemodialysis treatment.  You take certain medicines for conditions such as cancer, organ transplantation, and autoimmune conditions.  Hepatitis C blood testing is recommended for all people born from [birth year] through 1965 and any individual with known risks for hepatitis C.  Practice safe sex. Use condoms and avoid high-risk sexual practices to reduce the spread of sexually transmitted infections (STIs). STIs include gonorrhea, chlamydia, syphilis, trichomonas, herpes, HPV, and human immunodeficiency virus (HIV). Herpes, HIV, and HPV are viral illnesses that have no cure. They can result in disability, cancer, and death.  A one-time screening for abdominal aortic aneurysm (AAA)  and surgical repair of large AAAs by ultrasound are recommended for men ages 17 to 11 years who are current or former smokers.  Healthy men should no longer receive prostate-specific antigen (PSA) blood tests as part of routine cancer screening. Talk with your health care provider about prostate cancer screening.  Testicular cancer screening is not recommended for adult males who have no symptoms. Screening includes self-exam, a health care provider exam, and other screening tests. Consult with your health care provider about any symptoms you have or any concerns you have about testicular cancer.  Use sunscreen. Apply sunscreen liberally and repeatedly throughout the day. You should seek shade when your shadow is shorter than you. Protect yourself by wearing long sleeves, pants, a wide-brimmed hat, and sunglasses year round, whenever you are outdoors.  Once a month, do a whole-body skin exam, using a mirror to look at the skin  on your back. Tell your health care provider about new moles, moles that have irregular borders, moles that are larger than a pencil eraser, or moles that have changed in shape or color.  Stay current with required vaccines (immunizations).  Influenza vaccine. All adults should be immunized every year.  Tetanus, diphtheria, and acellular pertussis (Td, Tdap) vaccine. An adult who has not previously received Tdap or who does not know his vaccine status should receive 1 dose of Tdap. This initial dose should be followed by tetanus and diphtheria toxoids (Td) booster doses every 10 years. Adults with an unknown or incomplete history of completing a 3-dose immunization series with Td-containing vaccines should begin or complete a primary immunization series including a Tdap dose. Adults should receive a Td booster every 10 years.  Varicella vaccine. An adult without evidence of immunity to varicella should receive 2 doses or a second dose if he has previously received 1 dose.  Human papillomavirus (HPV) vaccine. Males aged 90 21 years who have not received the vaccine previously should receive the 3-dose series. Males aged 20 26 years may be immunized. Immunization is recommended through the age of 24 years for any male who has sex with males and did not get any or all doses earlier. Immunization is recommended for any person with an immunocompromised condition through the age of 79 years if he did not get any or all doses earlier. During the 3-dose series, the second dose should be obtained 4 8 weeks after the first dose. The third dose should be obtained 24 weeks after the first dose and 16 weeks after the second dose.  Zoster vaccine. One dose is recommended for adults aged 71 years or older unless certain conditions are present.  Measles, mumps, and rubella (MMR) vaccine. Adults born before 35 generally are considered immune to measles and mumps. Adults born in [birth year] or later should have 1 or more  doses of MMR vaccine unless there is a contraindication to the vaccine or there is laboratory evidence of immunity to each of the three diseases. A routine second dose of MMR vaccine should be obtained at least 28 days after the first dose for students attending postsecondary schools, health care workers, or international travelers. People who received inactivated measles vaccine or an unknown type of measles vaccine during 1963 1967 should receive 2 doses of MMR vaccine. People who received inactivated mumps vaccine or an unknown type of mumps vaccine before 1979 and are at high risk for mumps infection should consider immunization with 2 doses of MMR vaccine. Unvaccinated health care workers born before [birth year] who  lack laboratory evidence of measles, mumps, or rubella immunity or laboratory confirmation of disease should consider measles and mumps immunization with 2 doses of MMR vaccine or rubella immunization with 1 dose of MMR vaccine.  Pneumococcal 13-valent conjugate (PCV13) vaccine. When indicated, a person who is uncertain of his immunization history and has no record of immunization should receive the PCV13 vaccine. An adult aged 64 years or older who has certain medical conditions and has not been previously immunized should receive 1 dose of PCV13 vaccine. This PCV13 should be followed with a dose of pneumococcal polysaccharide (PPSV23) vaccine. The PPSV23 vaccine dose should be obtained at least 8 weeks after the dose of PCV13 vaccine. An adult aged 58 years or older who has certain medical conditions and previously received 1 or more doses of PPSV23 vaccine should receive 1 dose of PCV13. The PCV13 vaccine dose should be obtained 1 or more years after the last PPSV23 vaccine dose.  Pneumococcal polysaccharide (PPSV23) vaccine. When PCV13 is also indicated, PCV13 should be obtained first. All adults aged 84 years and older should be immunized. An adult younger than age 71 years who has certain medical  conditions should be immunized. Any person who resides in a nursing home or long-term care facility should be immunized. An adult smoker should be immunized. People with an immunocompromised condition and certain other conditions should receive both PCV13 and PPSV23 vaccines. People with human immunodeficiency virus (HIV) infection should be immunized as soon as possible after diagnosis. Immunization during chemotherapy or radiation therapy should be avoided. Routine use of PPSV23 vaccine is not recommended for American Indians, [place] Natives, or people younger than 65 years unless there are medical conditions that require PPSV23 vaccine. When indicated, people who have unknown immunization and have no record of immunization should receive PPSV23 vaccine. One-time revaccination 5 years after the first dose of PPSV23 is recommended for people aged 23 64 years who have chronic kidney failure, nephrotic syndrome, asplenia, or immunocompromised conditions. People who received 1 2 doses of PPSV23 before age 62 years should receive another dose of PPSV23 vaccine at age 49 years or later if at least 5 years have passed since the previous dose. Doses of PPSV23 are not needed for people immunized with PPSV23 at or after age 38 years.  Meningococcal vaccine. Adults with asplenia or persistent complement component deficiencies should receive 2 doses of quadrivalent meningococcal conjugate (MenACWY-D) vaccine. The doses should be obtained at least 2 months apart. Microbiologists working with certain meningococcal bacteria, [employer], people at risk during an outbreak, and people who travel to or live in countries with a high rate of meningitis should be immunized. A first-year college student up through age 23 years who is living in a residence hall should receive a dose if he did not receive a dose on or after his 16th birthday. Adults who have certain high-risk conditions should receive one or more doses of  vaccine.  Hepatitis A vaccine. Adults who wish to be protected from this disease, have certain high-risk conditions, work with hepatitis A-infected animals, work in hepatitis A research labs, or travel to or work in countries with a high rate of hepatitis A should be immunized. Adults who were previously unvaccinated and who anticipate close contact with an international adoptee during the first 60 days after arrival in the [place] from a country with a high rate of hepatitis A should be immunized.  Hepatitis B vaccine. Adults who wish to be protected from this disease,  have certain high-risk conditions, may be exposed to blood or other infectious body fluids, are household contacts or sex partners of hepatitis B positive people, are clients or workers in certain care facilities, or travel to or work in countries with a high rate of hepatitis B should be immunized.  Haemophilus influenzae type b (Hib) vaccine. A previously unvaccinated person with asplenia or sickle cell disease or having a scheduled splenectomy should receive 1 dose of Hib vaccine. Regardless of previous immunization, a recipient of a hematopoietic stem cell transplant should receive a 3-dose series 6 12 months after his successful transplant. Hib vaccine is not recommended for adults with HIV infection. Preventive Service / Frequency Ages 32 to 69  Blood pressure check.** / Every 1 to 2 years.  Lipid and cholesterol check.** / Every 5 years beginning at age 64.  Hepatitis C blood test.** / For any individual with known risks for hepatitis C.  Skin self-exam. / Monthly.  Influenza vaccine. / Every year.  Tetanus, diphtheria, and acellular pertussis (Tdap, Td) vaccine.** / Consult your health care provider. 1 dose of Td every 10 years.  Varicella vaccine.** / Consult your health care provider.  HPV vaccine. / 3 doses over 6 months, if 57 or younger.  Measles, mumps, rubella (MMR) vaccine.** / You need at least 1 dose  of MMR if you were born in 1957 or later. You may also need a second dose.  Pneumococcal 13-valent conjugate (PCV13) vaccine.** / Consult your health care provider.  Pneumococcal polysaccharide (PPSV23) vaccine.** / 1 to 2 doses if you smoke cigarettes or if you have certain conditions.  Meningococcal vaccine.** / 1 dose if you are age 96 to 34 years and a Market researcher living in a residence hall, or have one of several medical conditions. You may also need additional booster doses.  Hepatitis A vaccine.** / Consult your health care provider.  Hepatitis B vaccine.** / Consult your health care provider.  Haemophilus influenzae type b (Hib) vaccine.** / Consult your health care provider. Ages 58 to 34  Blood pressure check.** / Every 1 to 2 years.  Lipid and cholesterol check.** / Every 5 years beginning at age 32.  Lung cancer screening. / Every year if you are aged 9 80 years and have a 30-pack-year history of smoking and currently smoke or have quit within the past 15 years. Yearly screening is stopped once you have quit smoking for at least 15 years or develop a health problem that would prevent you from having lung cancer treatment.  Fecal occult blood test (FOBT) of stool. / Every year beginning at age 18 and continuing until age 28. You may not have to do this test if you get a colonoscopy every 10 years.  Flexible sigmoidoscopy** or colonoscopy.** / Every 5 years for a flexible sigmoidoscopy or every 10 years for a colonoscopy beginning at age 70 and continuing until age 30.  Hepatitis C blood test.** / For all people born from [birth year] through 1965 and any individual with known risks for hepatitis C.  Skin self-exam. / Monthly.  Influenza vaccine. / Every year.  Tetanus, diphtheria, and acellular pertussis (Tdap/Td) vaccine.** / Consult your health care provider. 1 dose of Td every 10 years.  Varicella vaccine.** / Consult your health care provider.  Zoster  vaccine.** / 1 dose for adults aged 80 years or older.  Measles, mumps, rubella (MMR) vaccine.** / You need at least 1 dose of MMR if you were born in 1957 or  later. You may also need a second dose.  Pneumococcal 13-valent conjugate (PCV13) vaccine.** / Consult your health care provider.  Pneumococcal polysaccharide (PPSV23) vaccine.** / 1 to 2 doses if you smoke cigarettes or if you have certain conditions.  Meningococcal vaccine.** / Consult your health care provider.  Hepatitis A vaccine.** / Consult your health care provider.  Hepatitis B vaccine.** / Consult your health care provider.  Haemophilus influenzae type b (Hib) vaccine.** / Consult your health care provider. Ages 52 and over  Blood pressure check.** / Every 1 to 2 years.  Lipid and cholesterol check.**/ Every 5 years beginning at age 62.  Lung cancer screening. / Every year if you are aged 76 80 years and have a 30-pack-year history of smoking and currently smoke or have quit within the past 15 years. Yearly screening is stopped once you have quit smoking for at least 15 years or develop a health problem that would prevent you from having lung cancer treatment.  Fecal occult blood test (FOBT) of stool. / Every year beginning at age 68 and continuing until age 5. You may not have to do this test if you get a colonoscopy every 10 years.  Flexible sigmoidoscopy** or colonoscopy.** / Every 5 years for a flexible sigmoidoscopy or every 10 years for a colonoscopy beginning at age 43 and continuing until age 57.  Hepatitis C blood test.** / For all people born from [birth year] through 1964 and any individual with known risks for hepatitis C.  Abdominal aortic aneurysm (AAA) screening.** / A one-time screening for ages 58 to 68 years who are current or former smokers.  Skin self-exam. / Monthly.  Influenza vaccine. / Every year.  Tetanus, diphtheria, and acellular pertussis (Tdap/Td) vaccine.** / 1 dose of Td every 10  years.  Varicella vaccine.** / Consult your health care provider.  Zoster vaccine.** / 1 dose for adults aged 76 years or older.  Pneumococcal 13-valent conjugate (PCV13) vaccine.** / Consult your health care provider.  Pneumococcal polysaccharide (PPSV23) vaccine.** / 1 dose for all adults aged 44 years and older.  Meningococcal vaccine.** / Consult your health care provider.  Hepatitis A vaccine.** / Consult your health care provider.  Hepatitis B vaccine.** / Consult your health care provider.  Haemophilus influenzae type b (Hib) vaccine.** / Consult your health care provider. **Family history and personal history of risk and conditions may change your health care provider's recommendations. Document Released: 09/29/2001 Document Revised: 05/24/2013 Document Reviewed: 12/29/2010 [facility] Patient Information 2014 [place], [place].

## 2013-12-28 NOTE — Assessment & Plan Note (Signed)
Continue allopurinol.  Monitor alcohol intake and intake of red meats and other purine-containing foods.

## 2013-12-28 NOTE — Progress Notes (Signed)
Patient presents to clinic today to establish care.  Acute Concerns: Needs refills of mediations  Chronic Issues: Allergic Rhinitis -- Needs refill of Singulair.  Asthma -- mild, infrequent.  Symptoms < 1 once per week.  Denies nighttime symptoms.  HAs albuterol inhaler but has not used in several months.  Gout -- On allopurinol for gout prophylaxis.  Hyperlipidemia -- currently on Crestor.  Denies myalgias/arthralgias.   Health Maintenance: Dental -- Overdue Vision -- UTD Immunizations -- Will need tetanus today.  Will check on cost of other immunizations. Colonoscopy -- 2011; due in 2016 -- benign polyps  Past Medical History  Diagnosis Date  . Asthma   . Hyperlipidemia   . Gout   . Abnormal stress test 2013    stress ECHO pending  . Syncope 2012    overheated withpostural  hypotension in flight  . Family history of colonic polyps     Mother & brother  . Chicken pox     Past Surgical History  Procedure Laterality Date  . Tonsillectomy    . Sigmoidoscopy      age 88  . Anterior cruciate ligament repair      right knee  . Colonoscopy  2011    [place] , [place]  . Wisdom tooth extraction      Current Outpatient Prescriptions on File Prior to Visit  Medication Sig Dispense Refill  . albuterol (VENTOLIN HFA) 108 (90 BASE) MCG/ACT inhaler TAKE 1 TO 2 PUFFS EVERY 4 HOURS AS NEEDED  3 Inhaler  2  . Multiple Vitamin (MULTIVITAMIN) tablet Take 1 tablet by mouth daily.         No current facility-administered medications on file prior to visit.    Allergies  Allergen Reactions  . Indomethacin     Mental status changes    Family History  Problem Relation Age of Onset  . Diabetes Mother     AODM, non IDDM  . Hyperlipidemia Mother     Living  . Colon polyps Mother   . Lung cancer Father 36    smoker; Deceased  . Colon polyps Brother   . Gout Brother   . Stroke Paternal Grandmother     in 11s  . Heart attack Paternal Uncle     in 80s  . Healthy Brother    #2  . Healthy Sister     x1    History   Social History  . Marital Status: Married    Spouse Name: N/A    Number of Children: N/A  . Years of Education: N/A   Occupational History  . Not on file.   Social History Main Topics  . Smoking status: Former Smoker    Quit date: 08/17/1980  . Smokeless tobacco: Not on file     Comment: 2 years in his 93s , < 1 ppd  . Alcohol Use: 8.4 oz/week    14 Cans of beer per week  . Drug Use: No  . Sexual Activity: Not on file   Other Topics Concern  . Not on file   Social History Narrative   Restricts red meat   Review of Systems  Constitutional: Negative for fever and weight loss.  HENT: Negative for ear discharge, ear pain, hearing loss and tinnitus.   Eyes: Negative for blurred vision, double vision, photophobia and pain.  Respiratory: Negative for cough and shortness of breath.   Cardiovascular: Negative for chest pain and palpitations.  Gastrointestinal: Negative for heartburn, nausea, vomiting, abdominal pain, diarrhea,  constipation, blood in stool and melena.  Genitourinary: Negative for dysuria, urgency, frequency, hematuria and flank pain.       Nocturia x 1  Neurological: Negative for loss of consciousness and headaches.  Endo/Heme/Allergies: Positive for environmental allergies.  Psychiatric/Behavioral: Negative for depression, suicidal ideas, hallucinations and substance abuse. The patient is not nervous/anxious.    BP 124/88  Pulse 67  Temp(Src) 98.7 F (37.1 C) (Oral)  Resp 16  Ht 5' 11.5" (1.816 m)  Wt 221 lb 4 oz (100.358 kg)  BMI 30.43 kg/m2  SpO2 99%  Physical Exam  Vitals reviewed. Constitutional: He is oriented to person, place, and time and well-developed, well-nourished, and in no distress.  HENT:  Head: Normocephalic and atraumatic.  Right Ear: External ear normal.  Left Ear: External ear normal.  Nose: Nose normal.  Mouth/Throat: Oropharynx is clear and moist. No oropharyngeal exudate.  Eyes:  Conjunctivae and EOM are normal. Pupils are equal, round, and reactive to light.  Neck: Normal range of motion. Neck supple.  Cardiovascular: Normal rate, regular rhythm, normal heart sounds and intact distal pulses.   Pulmonary/Chest: Effort normal and breath sounds normal. No respiratory distress. He has no wheezes. He has no rales. He exhibits no tenderness.  Abdominal: Soft. Bowel sounds are normal. He exhibits no distension and no mass. There is no tenderness. There is no rebound and no guarding.  Musculoskeletal: Normal range of motion.  Lymphadenopathy:    He has no cervical adenopathy.  Neurological: He is alert and oriented to person, place, and time.  Skin: Skin is warm and dry. No rash noted.  Psychiatric: Affect normal.   Assessment/Plan: HYPERLIPIDEMIA Continue current regimen.  Will obtain fasting lipid panel and hepatic function panel  GOUT Continue allopurinol.  Monitor alcohol intake and intake of red meats and other purine-containing foods.  Visit for preventive health examination Medical history reviewed and updated.  TDap given.  Patient endorses UTD on colonoscopy.  Will obtain fasting labs.  Prostate cancer screening Will obtain PSA.

## 2013-12-28 NOTE — Assessment & Plan Note (Signed)
Will obtain PSA

## 2013-12-28 NOTE — Progress Notes (Signed)
Pre visit review using our clinic review tool, if applicable. No additional management support is needed unless otherwise documented below in the visit note/SLS  

## 2013-12-28 NOTE — Assessment & Plan Note (Signed)
Continue current regimen.  Will obtain fasting lipid panel and hepatic function panel

## 2013-12-28 NOTE — Assessment & Plan Note (Addendum)
Medical history reviewed and updated.  TDap given.  Patient endorses UTD on colonoscopy.  Will obtain fasting labs.

## 2013-12-29 LAB — URINALYSIS, MICROSCOPIC ONLY
BACTERIA UA: NONE SEEN
Casts: NONE SEEN
Crystals: NONE SEEN
Squamous Epithelial / LPF: NONE SEEN

## 2013-12-29 LAB — TSH: TSH: 1.045 u[IU]/mL (ref 0.350–4.500)

## 2013-12-29 LAB — PSA: PSA: 1.89 ng/mL (ref <=4.00)

## 2014-02-23 ENCOUNTER — Other Ambulatory Visit: Payer: Self-pay | Admitting: *Deleted

## 2014-02-23 DIAGNOSIS — J45909 Unspecified asthma, uncomplicated: Secondary | ICD-10-CM

## 2014-02-23 MED ORDER — FLUTICASONE PROPIONATE 50 MCG/ACT NA SUSP: 2.0000 | Freq: Every day | NASAL | Status: DC

## 2014-02-23 MED ORDER — MONTELUKAST SODIUM 10 MG PO TABS: 10.0000 mg | ORAL_TABLET | Freq: Every day | ORAL | Status: DC

## 2014-02-23 NOTE — Progress Notes (Signed)
Per patient request and provider VO, Rx[s] sent to pharmacy; patient informed/SLS

## 2014-09-20 ENCOUNTER — Other Ambulatory Visit: Payer: Self-pay | Admitting: Physician Assistant

## 2014-12-16 ENCOUNTER — Other Ambulatory Visit: Payer: Self-pay | Admitting: Physician Assistant

## 2014-12-28 ENCOUNTER — Other Ambulatory Visit: Payer: Self-pay | Admitting: Physician Assistant

## 2015-01-14 ENCOUNTER — Other Ambulatory Visit: Payer: Self-pay | Admitting: Physician Assistant

## 2015-01-15 NOTE — Telephone Encounter (Signed)
Rx's sent to the pharmacy by e-script.//AB/CMA 

## 2015-05-06 ENCOUNTER — Encounter: Payer: Self-pay | Admitting: Physician Assistant

## 2015-05-06 ENCOUNTER — Ambulatory Visit (INDEPENDENT_AMBULATORY_CARE_PROVIDER_SITE_OTHER): Payer: Managed Care, Other (non HMO) | Admitting: Physician Assistant

## 2015-05-06 VITALS — BP 128/72 | HR 84 | Temp 98.3°F | Resp 16 | Ht 72.0 in | Wt 219.4 lb

## 2015-05-06 DIAGNOSIS — E782 Mixed hyperlipidemia: Secondary | ICD-10-CM | POA: Diagnosis not present

## 2015-05-06 MED ORDER — LORATADINE 10 MG PO TABS: 5.0000 mg | ORAL_TABLET | Freq: Every day | ORAL | Status: DC | PRN

## 2015-05-06 MED ORDER — COLCHICINE 0.6 MG PO CAPS: 0.6000 mg | ORAL_CAPSULE | ORAL | Status: DC

## 2015-05-06 MED ORDER — ALBUTEROL SULFATE HFA 108 (90 BASE) MCG/ACT IN AERS: INHALATION_SPRAY | RESPIRATORY_TRACT | Status: DC

## 2015-05-06 MED ORDER — ROSUVASTATIN CALCIUM 20 MG PO TABS: ORAL_TABLET | ORAL | Status: DC

## 2015-05-06 NOTE — Assessment & Plan Note (Signed)
Stable. Will refill branded Crestor. Patient to schedule CPE ASAP so fasting labs can be obtained.

## 2015-05-06 NOTE — Patient Instructions (Signed)
Please continue medications as directed. Stay well hydrated. Schedule an appointment for a complete physical on your way out. Come fasting to that appointment.

## 2015-05-06 NOTE — Progress Notes (Signed)
   Patient presents to clinic today for medication management for hyperlipidemia. Has not been seen in 15 months. Is taking Crestor daily as directed. Last Rx sent in was filled as a generic prescription and he states he notes some myalgias since last Rx. Is intermittent per patient.  Past Medical History  Diagnosis Date  . Asthma   . Hyperlipidemia   . Gout   . Abnormal stress test 2013    stress ECHO pending  . Syncope 2012    overheated withpostural  hypotension in flight  . Family history of colonic polyps     Mother & brother  . Chicken pox     Current Outpatient Prescriptions on File Prior to Visit  Medication Sig Dispense Refill  . allopurinol (ZYLOPRIM) 100 MG tablet TAKE 1 TABLET (100 MG TOTAL) BY MOUTH DAILY. 30 tablet 5  . montelukast (SINGULAIR) 10 MG tablet TAKE 1 TABLET (10 MG TOTAL) BY MOUTH AT BEDTIME. 30 tablet 5  . Multiple Vitamin (MULTIVITAMIN) tablet Take 1 tablet by mouth daily.       No current facility-administered medications on file prior to visit.    Allergies  Allergen Reactions  . Indomethacin     Mental status changes    Family History  Problem Relation Age of Onset  . Diabetes Mother     AODM, non IDDM  . Hyperlipidemia Mother     Living  . Colon polyps Mother   . Lung cancer Father 82    smoker; Deceased  . Colon polyps Brother   . Gout Brother   . Stroke Paternal Grandmother     in 37s  . Heart attack Paternal Uncle     in 82s  . Healthy Brother     #2  . Healthy Sister     x1    Social History   Social History  . Marital Status: Married    Spouse Name: N/A  . Number of Children: N/A  . Years of Education: N/A   Social History Main Topics  . Smoking status: Former Smoker    Quit date: 08/17/1980  . Smokeless tobacco: None     Comment: 2 years in his 61s , < 1 ppd  . Alcohol Use: 8.4 oz/week    14 Cans of beer per week  . Drug Use: No  . Sexual Activity: Not Asked   Other Topics Concern  . None   Social History  Narrative   Restricts red meat   Review of Systems - See HPI.  All other ROS are negative.  BP 128/72 mmHg  Pulse 84  Temp(Src) 98.3 F (36.8 C) (Oral)  Resp 16  Ht 6' (1.829 m)  Wt 219 lb 6 oz (99.508 kg)  BMI 29.75 kg/m2  SpO2 98%  Physical Exam  Constitutional: He is well-developed, well-nourished, and in no distress.  HENT:  Head: Normocephalic and atraumatic.  Cardiovascular: Normal rate, regular rhythm, normal heart sounds and intact distal pulses.   Pulmonary/Chest: Effort normal and breath sounds normal. No respiratory distress. He has no wheezes. He has no rales. He exhibits no tenderness.  Skin: Skin is warm and dry. No rash noted.  Psychiatric: Affect normal.  Vitals reviewed.  Assessment/Plan: HYPERLIPIDEMIA Stable. Will refill branded Crestor. Patient to schedule CPE ASAP so fasting labs can be obtained.

## 2015-05-06 NOTE — Progress Notes (Signed)
Pre visit review using our clinic review tool, if applicable. No additional management support is needed unless otherwise documented below in the visit note/SLS  

## 2015-05-07 ENCOUNTER — Encounter: Payer: Self-pay | Admitting: Physician Assistant

## 2015-05-07 ENCOUNTER — Ambulatory Visit (INDEPENDENT_AMBULATORY_CARE_PROVIDER_SITE_OTHER): Payer: Managed Care, Other (non HMO) | Admitting: Physician Assistant

## 2015-05-07 VITALS — BP 120/86 | HR 76 | Temp 98.1°F | Resp 16 | Ht 72.0 in | Wt 220.0 lb

## 2015-05-07 DIAGNOSIS — M109 Gout, unspecified: Secondary | ICD-10-CM

## 2015-05-07 DIAGNOSIS — Z0000 Encounter for general adult medical examination without abnormal findings: Secondary | ICD-10-CM

## 2015-05-07 DIAGNOSIS — E782 Mixed hyperlipidemia: Secondary | ICD-10-CM

## 2015-05-07 LAB — COMPREHENSIVE METABOLIC PANEL
ALK PHOS: 46 U/L (ref 39–117)
ALT: 19 U/L (ref 0–53)
AST: 18 U/L (ref 0–37)
Albumin: 4.2 g/dL (ref 3.5–5.2)
BUN: 13 mg/dL (ref 6–23)
CO2: 31 mEq/L (ref 19–32)
Calcium: 9.6 mg/dL (ref 8.4–10.5)
Chloride: 102 mEq/L (ref 96–112)
Creatinine, Ser: 0.93 mg/dL (ref 0.40–1.50)
GFR: 88.61 mL/min (ref >60.00)
GLUCOSE: 102 mg/dL — AB (ref 70–99)
POTASSIUM: 4.7 meq/L (ref 3.5–5.1)
Sodium: 139 mEq/L (ref 135–145)
Total Bilirubin: 0.7 mg/dL (ref 0.2–1.2)
Total Protein: 6.7 g/dL (ref 6.0–8.3)

## 2015-05-07 LAB — URINALYSIS, ROUTINE W REFLEX MICROSCOPIC
BILIRUBIN URINE: NEGATIVE
HGB URINE DIPSTICK: NEGATIVE
KETONES UR: NEGATIVE
LEUKOCYTES UA: NEGATIVE
Nitrite: NEGATIVE
Specific Gravity, Urine: 1.015 (ref 1.000–1.030)
TOTAL PROTEIN, URINE-UPE24: NEGATIVE
URINE GLUCOSE: NEGATIVE
UROBILINOGEN UA: 0.2 (ref 0.0–1.0)
WBC, UA: NONE SEEN — AB (ref 0–?)
pH: 7 (ref 5.0–8.0)

## 2015-05-07 LAB — LIPID PANEL
CHOLESTEROL: 144 mg/dL (ref 0–200)
HDL: 43.6 mg/dL (ref >39.00)
LDL Cholesterol: 80 mg/dL (ref 0–99)
NONHDL: 100.18
Total CHOL/HDL Ratio: 3
Triglycerides: 102 mg/dL (ref 0.0–149.0)
VLDL: 20.4 mg/dL (ref 0.0–40.0)

## 2015-05-07 LAB — CBC
HCT: 45.5 % (ref 39.0–52.0)
HEMOGLOBIN: 15.4 g/dL (ref 13.0–17.0)
MCHC: 33.8 g/dL (ref 30.0–36.0)
MCV: 95.4 fl (ref 78.0–100.0)
Platelets: 214 10*3/uL (ref 150.0–400.0)
RBC: 4.76 Mil/uL (ref 4.22–5.81)
RDW: 12.6 % (ref 11.5–15.5)
WBC: 4.3 10*3/uL (ref 4.0–10.5)

## 2015-05-07 LAB — TSH: TSH: 1.26 u[IU]/mL (ref 0.35–4.50)

## 2015-05-07 LAB — PSA: PSA: 2.1 ng/mL (ref 0.10–4.00)

## 2015-05-07 LAB — URIC ACID: Uric Acid, Serum: 6 mg/dL (ref 4.0–7.8)

## 2015-05-07 LAB — HEMOGLOBIN A1C: HEMOGLOBIN A1C: 5.5 % (ref 4.6–6.5)

## 2015-05-07 NOTE — Progress Notes (Signed)
Pre visit review using our clinic review tool, if applicable. No additional management support is needed unless otherwise documented below in the visit note/SLS  

## 2015-05-07 NOTE — Patient Instructions (Signed)
Please go to the lab for blood work.  I will call you with your results. If your blood work is normal we will follow-up yearly for physicals.  We will treat any abnormal findings accordingly.  Please continue medications as directed.  Preventive Care for Adults A healthy lifestyle and preventive care can promote health and wellness. Preventive health guidelines for men include the following key practices:  A routine yearly physical is a good way to check with your health care provider about your health and preventative screening. It is a chance to share any concerns and updates on your health and to receive a thorough exam.  Visit your dentist for a routine exam and preventative care every 6 months. Brush your teeth twice a day and floss once a day. Good oral hygiene prevents tooth decay and gum disease.  The frequency of eye exams is based on your age, health, family medical history, use of contact lenses, and other factors. Follow your health care provider's recommendations for frequency of eye exams.  Eat a healthy diet. Foods such as vegetables, fruits, whole grains, low-fat dairy products, and lean protein foods contain the nutrients you need without too many calories. Decrease your intake of foods high in solid fats, added sugars, and salt. Eat the right amount of calories for you.Get information about a proper diet from your health care provider, if necessary.  Regular physical exercise is one of the most important things you can do for your health. Most adults should get at least 150 minutes of moderate-intensity exercise (any activity that increases your heart rate and causes you to sweat) each week. In addition, most adults need muscle-strengthening exercises on 2 or more days a week.  Maintain a healthy weight. The body mass index (BMI) is a screening tool to identify possible weight problems. It provides an estimate of body fat based on height and weight. Your health care provider  can find your BMI and can help you achieve or maintain a healthy weight.For adults 20 years and older:  A BMI below 18.5 is considered underweight.  A BMI of 18.5 to 24.9 is normal.  A BMI of 25 to 29.9 is considered overweight.  A BMI of 30 and above is considered obese.  Maintain normal blood lipids and cholesterol levels by exercising and minimizing your intake of saturated fat. Eat a balanced diet with plenty of fruit and vegetables. Blood tests for lipids and cholesterol should begin at age 48 and be repeated every 5 years. If your lipid or cholesterol levels are high, you are over 50, or you are at high risk for heart disease, you may need your cholesterol levels checked more frequently.Ongoing high lipid and cholesterol levels should be treated with medicines if diet and exercise are not working.  If you smoke, find out from your health care provider how to quit. If you do not use tobacco, do not start.  Lung cancer screening is recommended for adults aged 63-80 years who are at high risk for developing lung cancer because of a history of smoking. A yearly low-dose CT scan of the lungs is recommended for people who have at least a 30-pack-year history of smoking and are a current smoker or have quit within the past 15 years. A pack year of smoking is smoking an average of 1 pack of cigarettes a day for 1 year (for example: 1 pack a day for 30 years or 2 packs a day for 15 years). Yearly screening should continue until  the smoker has stopped smoking for at least 15 years. Yearly screening should be stopped for people who develop a health problem that would prevent them from having lung cancer treatment.  If you choose to drink alcohol, do not have more than 2 drinks per day. One drink is considered to be 12 ounces (355 mL) of beer, 5 ounces (148 mL) of wine, or 1.5 ounces (44 mL) of liquor.  Avoid use of street drugs. Do not share needles with anyone. Ask for help if you need support or  instructions about stopping the use of drugs.  High blood pressure causes heart disease and increases the risk of stroke. Your blood pressure should be checked at least every 1-2 years. Ongoing high blood pressure should be treated with medicines, if weight loss and exercise are not effective.  If you are 51-71 years old, ask your health care provider if you should take aspirin to prevent heart disease.  Diabetes screening involves taking a blood sample to check your fasting blood sugar level. This should be done once every 3 years, after age 49, if you are within normal weight and without risk factors for diabetes. Testing should be considered at a younger age or be carried out more frequently if you are overweight and have at least 1 risk factor for diabetes.  Colorectal cancer can be detected and often prevented. Most routine colorectal cancer screening begins at the age of 34 and continues through age 61. However, your health care provider may recommend screening at an earlier age if you have risk factors for colon cancer. On a yearly basis, your health care provider may provide home test kits to check for hidden blood in the stool. Use of a small camera at the end of a tube to directly examine the colon (sigmoidoscopy or colonoscopy) can detect the earliest forms of colorectal cancer. Talk to your health care provider about this at age 48, when routine screening begins. Direct exam of the colon should be repeated every 5-10 years through age 67, unless early forms of precancerous polyps or small growths are found.  People who are at an increased risk for hepatitis B should be screened for this virus. You are considered at high risk for hepatitis B if:  You were born in a country where hepatitis B occurs often. Talk with your health care provider about which countries are considered high risk.  Your parents were born in a high-risk country and you have not received a shot to protect against  hepatitis B (hepatitis B vaccine).  You have HIV or AIDS.  You use needles to inject street drugs.  You live with, or have sex with, someone who has hepatitis B.  You are a man who has sex with other men (MSM).  You get hemodialysis treatment.  You take certain medicines for conditions such as cancer, organ transplantation, and autoimmune conditions.  Hepatitis C blood testing is recommended for all people born from [birth year] through 1965 and any individual with known risks for hepatitis C.  Practice safe sex. Use condoms and avoid high-risk sexual practices to reduce the spread of sexually transmitted infections (STIs). STIs include gonorrhea, chlamydia, syphilis, trichomonas, herpes, HPV, and human immunodeficiency virus (HIV). Herpes, HIV, and HPV are viral illnesses that have no cure. They can result in disability, cancer, and death.  If you are at risk of being infected with HIV, it is recommended that you take a prescription medicine daily to prevent HIV infection. This is called  preexposure prophylaxis (PrEP). You are considered at risk if:  You are a man who has sex with other men (MSM) and have other risk factors.  You are a heterosexual man, are sexually active, and are at increased risk for HIV infection.  You take drugs by injection.  You are sexually active with a partner who has HIV.  Talk with your health care provider about whether you are at high risk of being infected with HIV. If you choose to begin PrEP, you should first be tested for HIV. You should then be tested every 3 months for as long as you are taking PrEP.  A one-time screening for abdominal aortic aneurysm (AAA) and surgical repair of large AAAs by ultrasound are recommended for men ages 55 to 72 years who are current or former smokers.  Healthy men should no longer receive prostate-specific antigen (PSA) blood tests as part of routine cancer screening. Talk with your health care provider about prostate cancer  screening.  Testicular cancer screening is not recommended for adult males who have no symptoms. Screening includes self-exam, a health care provider exam, and other screening tests. Consult with your health care provider about any symptoms you have or any concerns you have about testicular cancer.  Use sunscreen. Apply sunscreen liberally and repeatedly throughout the day. You should seek shade when your shadow is shorter than you. Protect yourself by wearing long sleeves, pants, a wide-brimmed hat, and sunglasses year round, whenever you are outdoors.  Once a month, do a whole-body skin exam, using a mirror to look at the skin on your back. Tell your health care provider about new moles, moles that have irregular borders, moles that are larger than a pencil eraser, or moles that have changed in shape or color.  Stay current with required vaccines (immunizations).  Influenza vaccine. All adults should be immunized every year.  Tetanus, diphtheria, and acellular pertussis (Td, Tdap) vaccine. An adult who has not previously received Tdap or who does not know his vaccine status should receive 1 dose of Tdap. This initial dose should be followed by tetanus and diphtheria toxoids (Td) booster doses every 10 years. Adults with an unknown or incomplete history of completing a 3-dose immunization series with Td-containing vaccines should begin or complete a primary immunization series including a Tdap dose. Adults should receive a Td booster every 10 years.  Varicella vaccine. An adult without evidence of immunity to varicella should receive 2 doses or a second dose if he has previously received 1 dose.  Human papillomavirus (HPV) vaccine. Males aged 80-21 years who have not received the vaccine previously should receive the 3-dose series. Males aged 22-26 years may be immunized. Immunization is recommended through the age of 55 years for any male who has sex with males and did not get any or all doses  earlier. Immunization is recommended for any person with an immunocompromised condition through the age of 67 years if he did not get any or all doses earlier. During the 3-dose series, the second dose should be obtained 4-8 weeks after the first dose. The third dose should be obtained 24 weeks after the first dose and 16 weeks after the second dose.  Zoster vaccine. One dose is recommended for adults aged 41 years or older unless certain conditions are present.  Measles, mumps, and rubella (MMR) vaccine. Adults born before [birth year] generally are considered immune to measles and mumps. Adults born in [birth year] or later should have 1 or more doses of MMR  vaccine unless there is a contraindication to the vaccine or there is laboratory evidence of immunity to each of the three diseases. A routine second dose of MMR vaccine should be obtained at least 28 days after the first dose for students attending postsecondary schools, health care workers, or international travelers. People who received inactivated measles vaccine or an unknown type of measles vaccine during 1963-1967 should receive 2 doses of MMR vaccine. People who received inactivated mumps vaccine or an unknown type of mumps vaccine before 1979 and are at high risk for mumps infection should consider immunization with 2 doses of MMR vaccine. Unvaccinated health care workers born before [birth year] who lack laboratory evidence of measles, mumps, or rubella immunity or laboratory confirmation of disease should consider measles and mumps immunization with 2 doses of MMR vaccine or rubella immunization with 1 dose of MMR vaccine.  Pneumococcal 13-valent conjugate (PCV13) vaccine. When indicated, a person who is uncertain of his immunization history and has no record of immunization should receive the PCV13 vaccine. An adult aged 71 years or older who has certain medical conditions and has not been previously immunized should receive 1 dose of PCV13 vaccine. This PCV13  should be followed with a dose of pneumococcal polysaccharide (PPSV23) vaccine. The PPSV23 vaccine dose should be obtained at least 8 weeks after the dose of PCV13 vaccine. An adult aged 32 years or older who has certain medical conditions and previously received 1 or more doses of PPSV23 vaccine should receive 1 dose of PCV13. The PCV13 vaccine dose should be obtained 1 or more years after the last PPSV23 vaccine dose.  Pneumococcal polysaccharide (PPSV23) vaccine. When PCV13 is also indicated, PCV13 should be obtained first. All adults aged 70 years and older should be immunized. An adult younger than age 80 years who has certain medical conditions should be immunized. Any person who resides in a nursing home or long-term care facility should be immunized. An adult smoker should be immunized. People with an immunocompromised condition and certain other conditions should receive both PCV13 and PPSV23 vaccines. People with human immunodeficiency virus (HIV) infection should be immunized as soon as possible after diagnosis. Immunization during chemotherapy or radiation therapy should be avoided. Routine use of PPSV23 vaccine is not recommended for American Indians, [place] Natives, or people younger than 65 years unless there are medical conditions that require PPSV23 vaccine. When indicated, people who have unknown immunization and have no record of immunization should receive PPSV23 vaccine. One-time revaccination 5 years after the first dose of PPSV23 is recommended for people aged 19-64 years who have chronic kidney failure, nephrotic syndrome, asplenia, or immunocompromised conditions. People who received 1-2 doses of PPSV23 before age 37 years should receive another dose of PPSV23 vaccine at age 48 years or later if at least 5 years have passed since the previous dose. Doses of PPSV23 are not needed for people immunized with PPSV23 at or after age 40 years.  Meningococcal vaccine. Adults with asplenia or  persistent complement component deficiencies should receive 2 doses of quadrivalent meningococcal conjugate (MenACWY-D) vaccine. The doses should be obtained at least 2 months apart. Microbiologists working with certain meningococcal bacteria, [place] recruits, people at risk during an outbreak, and people who travel to or live in countries with a high rate of meningitis should be immunized. A first-year college student up through age 15 years who is living in a residence hall should receive a dose if he did not receive a dose on or after his 16th  birthday. Adults who have certain high-risk conditions should receive one or more doses of vaccine.  Hepatitis A vaccine. Adults who wish to be protected from this disease, have certain high-risk conditions, work with hepatitis A-infected animals, work in hepatitis A research labs, or travel to or work in countries with a high rate of hepatitis A should be immunized. Adults who were previously unvaccinated and who anticipate close contact with an international adoptee during the first 60 days after arrival in the [place] from a country with a high rate of hepatitis A should be immunized.  Hepatitis B vaccine. Adults should be immunized if they wish to be protected from this disease, have certain high-risk conditions, may be exposed to blood or other infectious body fluids, are household contacts or sex partners of hepatitis B positive people, are clients or workers in certain care facilities, or travel to or work in countries with a high rate of hepatitis B.  Haemophilus influenzae type b (Hib) vaccine. A previously unvaccinated person with asplenia or sickle cell disease or having a scheduled splenectomy should receive 1 dose of Hib vaccine. Regardless of previous immunization, a recipient of a hematopoietic stem cell transplant should receive a 3-dose series 6-12 months after his successful transplant. Hib vaccine is not recommended for adults with HIV  infection. Preventive Service / Frequency Ages 72 to 85  Blood pressure check.** / Every 1 to 2 years.  Lipid and cholesterol check.** / Every 5 years beginning at age 15.  Hepatitis C blood test.** / For any individual with known risks for hepatitis C.  Skin self-exam. / Monthly.  Influenza vaccine. / Every year.  Tetanus, diphtheria, and acellular pertussis (Tdap, Td) vaccine.** / Consult your health care provider. 1 dose of Td every 10 years.  Varicella vaccine.** / Consult your health care provider.  HPV vaccine. / 3 doses over 6 months, if 53 or younger.  Measles, mumps, rubella (MMR) vaccine.** / You need at least 1 dose of MMR if you were born in 1957 or later. You may also need a second dose.  Pneumococcal 13-valent conjugate (PCV13) vaccine.** / Consult your health care provider.  Pneumococcal polysaccharide (PPSV23) vaccine.** / 1 to 2 doses if you smoke cigarettes or if you have certain conditions.  Meningococcal vaccine.** / 1 dose if you are age 21 to 91 years and a Market researcher living in a residence hall, or have one of several medical conditions. You may also need additional booster doses.  Hepatitis A vaccine.** / Consult your health care provider.  Hepatitis B vaccine.** / Consult your health care provider.  Haemophilus influenzae type b (Hib) vaccine.** / Consult your health care provider. Ages 60 to 47  Blood pressure check.** / Every 1 to 2 years.  Lipid and cholesterol check.** / Every 5 years beginning at age 68.  Lung cancer screening. / Every year if you are aged 66-80 years and have a 30-pack-year history of smoking and currently smoke or have quit within the past 15 years. Yearly screening is stopped once you have quit smoking for at least 15 years or develop a health problem that would prevent you from having lung cancer treatment.  Fecal occult blood test (FOBT) of stool. / Every year beginning at age 63 and continuing until age 25.  You may not have to do this test if you get a colonoscopy every 10 years.  Flexible sigmoidoscopy** or colonoscopy.** / Every 5 years for a flexible sigmoidoscopy or every 10 years for  a colonoscopy beginning at age 6 and continuing until age 21.  Hepatitis C blood test.** / For all people born from [birth year] through 1965 and any individual with known risks for hepatitis C.  Skin self-exam. / Monthly.  Influenza vaccine. / Every year.  Tetanus, diphtheria, and acellular pertussis (Tdap/Td) vaccine.** / Consult your health care provider. 1 dose of Td every 10 years.  Varicella vaccine.** / Consult your health care provider.  Zoster vaccine.** / 1 dose for adults aged 47 years or older.  Measles, mumps, rubella (MMR) vaccine.** / You need at least 1 dose of MMR if you were born in 1956 or later. You may also need a second dose.  Pneumococcal 13-valent conjugate (PCV13) vaccine.** / Consult your health care provider.  Pneumococcal polysaccharide (PPSV23) vaccine.** / 1 to 2 doses if you smoke cigarettes or if you have certain conditions.  Meningococcal vaccine.** / Consult your health care provider.  Hepatitis A vaccine.** / Consult your health care provider.  Hepatitis B vaccine.** / Consult your health care provider.  Haemophilus influenzae type b (Hib) vaccine.** / Consult your health care provider. Ages 30 and over  Blood pressure check.** / Every 1 to 2 years.  Lipid and cholesterol check.**/ Every 5 years beginning at age 38.  Lung cancer screening. / Every year if you are aged 92-80 years and have a 30-pack-year history of smoking and currently smoke or have quit within the past 15 years. Yearly screening is stopped once you have quit smoking for at least 15 years or develop a health problem that would prevent you from having lung cancer treatment.  Fecal occult blood test (FOBT) of stool. / Every year beginning at age 43 and continuing until age 2. You may not have to do this  test if you get a colonoscopy every 10 years.  Flexible sigmoidoscopy** or colonoscopy.** / Every 5 years for a flexible sigmoidoscopy or every 10 years for a colonoscopy beginning at age 78 and continuing until age 78.  Hepatitis C blood test.** / For all people born from [birth year] through 1964 and any individual with known risks for hepatitis C.  Abdominal aortic aneurysm (AAA) screening.** / A one-time screening for ages 42 to 28 years who are current or former smokers.  Skin self-exam. / Monthly.  Influenza vaccine. / Every year.  Tetanus, diphtheria, and acellular pertussis (Tdap/Td) vaccine.** / 1 dose of Td every 10 years.  Varicella vaccine.** / Consult your health care provider.  Zoster vaccine.** / 1 dose for adults aged 5 years or older.  Pneumococcal 13-valent conjugate (PCV13) vaccine.** / Consult your health care provider.  Pneumococcal polysaccharide (PPSV23) vaccine.** / 1 dose for all adults aged 8 years and older.  Meningococcal vaccine.** / Consult your health care provider.  Hepatitis A vaccine.** / Consult your health care provider.  Hepatitis B vaccine.** / Consult your health care provider.  Haemophilus influenzae type b (Hib) vaccine.** / Consult your health care provider. **Family history and personal history of risk and conditions may change your health care provider's recommendations. Document Released: 09/29/2001 Document Revised: 08/08/2013 Document Reviewed: 12/29/2010 [facility] Patient Information 2015 [place], [place]. This information is not intended to replace advice given to you by your health care provider. Make sure you discuss any questions you have with your health care provider.

## 2015-05-07 NOTE — Progress Notes (Signed)
Patient presents to clinic today for annual exam.  Patient is fasting for labs.  Chronic Issues: Hyperlipidemia -- Patient tolerating Crestor daily without significant myalgias with new QOD dosing.  Seasonal Allergies -- Well controlled with daily Claritin and Singulair.  Gout -- Currently on Allopurinol for prophylaxis. Colchicine at home for acute flares. Is watching diet. Needs repeat uric acid level.  Health Maintenance: Dental -- up-to-date Vision -- up-to-date Immunizations -- up-to-date on Tetanus. Declines flu shot. Colonoscopy -- up-to-date  Past Medical History  Diagnosis Date  . Asthma   . Hyperlipidemia   . Gout   . Abnormal stress test 2013    stress ECHO pending  . Syncope 2012    overheated withpostural  hypotension in flight  . Family history of colonic polyps     Mother & brother  . Chicken pox     Past Surgical History  Procedure Laterality Date  . Tonsillectomy    . Sigmoidoscopy      age 48  . Anterior cruciate ligament repair      right knee  . Colonoscopy  2011    [place] , [place]  . Wisdom tooth extraction      Current Outpatient Prescriptions on File Prior to Visit  Medication Sig Dispense Refill  . albuterol (VENTOLIN HFA) 108 (90 BASE) MCG/ACT inhaler TAKE 1 TO 2 PUFFS EVERY 4 HOURS AS NEEDED 1 Inhaler 1  . allopurinol (ZYLOPRIM) 100 MG tablet TAKE 1 TABLET (100 MG TOTAL) BY MOUTH DAILY. 30 tablet 5  . Colchicine 0.6 MG CAPS Take 0.6 mg by mouth as directed. 30 capsule 1  . loratadine (CLARITIN) 10 MG tablet Take 0.5-1 tablets (5-10 mg total) by mouth daily as needed for allergies. 30 tablet 2  . montelukast (SINGULAIR) 10 MG tablet TAKE 1 TABLET (10 MG TOTAL) BY MOUTH AT BEDTIME. 30 tablet 5  . Multiple Vitamin (MULTIVITAMIN) tablet Take 1 tablet by mouth daily.      . rosuvastatin (CRESTOR) 20 MG tablet TAKE 1 TABLET (20 MG TOTAL) BY MOUTH DAILY. 90 tablet 0   No current facility-administered medications on file prior to visit.     Allergies  Allergen Reactions  . Indomethacin     Mental status changes    Family History  Problem Relation Age of Onset  . Diabetes Mother     AODM, non IDDM  . Hyperlipidemia Mother     Living  . Colon polyps Mother   . Lung cancer Father 5    smoker; Deceased  . Colon polyps Brother   . Gout Brother   . Stroke Paternal Grandmother     in 21s  . Heart attack Paternal Uncle     in 78s  . Healthy Brother     #2  . Healthy Sister     x1    Social History   Social History  . Marital Status: Married    Spouse Name: N/A  . Number of Children: N/A  . Years of Education: N/A   Occupational History  . Not on file.   Social History Main Topics  . Smoking status: Former Smoker    Quit date: 08/17/1980  . Smokeless tobacco: Not on file     Comment: 2 years in his 52s , < 1 ppd  . Alcohol Use: 8.4 oz/week    14 Cans of beer per week  . Drug Use: No  . Sexual Activity: Not on file   Other Topics Concern  .  Not on file   Social History Narrative   Restricts red meat   Review of Systems  Constitutional: Negative for fever and weight loss.  HENT: Negative for ear discharge, ear pain, hearing loss and tinnitus.   Eyes: Negative for blurred vision, double vision, photophobia and pain.  Respiratory: Negative for cough and shortness of breath.   Cardiovascular: Negative for chest pain and palpitations.  Gastrointestinal: Negative for heartburn, nausea, vomiting, abdominal pain, diarrhea, constipation, blood in stool and melena.  Genitourinary: Negative for dysuria, urgency, frequency, hematuria and flank pain.  Musculoskeletal: Negative for falls.  Neurological: Negative for dizziness, loss of consciousness and headaches.  Endo/Heme/Allergies: Negative for environmental allergies.  Psychiatric/Behavioral: Negative for depression, suicidal ideas, hallucinations and substance abuse. The patient is not nervous/anxious and does not have insomnia.     BP 120/86 mmHg   Pulse 76  Temp(Src) 98.1 F (36.7 C) (Oral)  Resp 16  Ht 6' (1.829 m)  Wt 220 lb (99.791 kg)  BMI 29.83 kg/m2  SpO2 98%  Physical Exam  Constitutional: He is oriented to person, place, and time and well-developed, well-nourished, and in no distress.  HENT:  Head: Normocephalic and atraumatic.  Right Ear: External ear normal.  Left Ear: External ear normal.  Nose: Nose normal.  Mouth/Throat: Oropharynx is clear and moist. No oropharyngeal exudate.  Eyes: Conjunctivae and EOM are normal. Pupils are equal, round, and reactive to light.  Neck: Neck supple. No thyromegaly present.  Cardiovascular: Normal rate, regular rhythm, normal heart sounds and intact distal pulses.   Pulmonary/Chest: Effort normal and breath sounds normal. No respiratory distress. He has no wheezes. He has no rales. He exhibits no tenderness.  Abdominal: Soft. Bowel sounds are normal. He exhibits no distension and no mass. There is no tenderness. There is no rebound and no guarding.  Genitourinary: Testes/scrotum normal.  Lymphadenopathy:    He has no cervical adenopathy.  Neurological: He is alert and oriented to person, place, and time.  Skin: Skin is warm and dry. No rash noted.  Psychiatric: Affect normal.  Vitals reviewed.   Recent Results (from the past 2160 hour(s))  CBC     Status: None   Collection Time: 05/07/15 10:26 AM  Result Value Ref Range   WBC 4.3 4.0 - 10.5 K/uL   RBC 4.76 4.22 - 5.81 Mil/uL   Platelets 214.0 150.0 - 400.0 K/uL   Hemoglobin 15.4 13.0 - 17.0 g/dL   HCT 45.5 39.0 - 52.0 %   MCV 95.4 78.0 - 100.0 fl   MCHC 33.8 30.0 - 36.0 g/dL   RDW 12.6 11.5 - 15.5 %  Comp Met (CMET)     Status: Abnormal   Collection Time: 05/07/15 10:26 AM  Result Value Ref Range   Sodium 139 135 - 145 mEq/L   Potassium 4.7 3.5 - 5.1 mEq/L   Chloride 102 96 - 112 mEq/L   CO2 31 19 - 32 mEq/L   Glucose, Bld 102 (H) 70 - 99 mg/dL   BUN 13 6 - 23 mg/dL   Creatinine, Ser 0.93 0.40 - 1.50 mg/dL    Total Bilirubin 0.7 0.2 - 1.2 mg/dL   Alkaline Phosphatase 46 39 - 117 U/L   AST 18 0 - 37 U/L   ALT 19 0 - 53 U/L   Total Protein 6.7 6.0 - 8.3 g/dL   Albumin 4.2 3.5 - 5.2 g/dL   Calcium 9.6 8.4 - 10.5 mg/dL   GFR 88.61 >60.00 mL/min  TSH  Status: None   Collection Time: 05/07/15 10:26 AM  Result Value Ref Range   TSH 1.26 0.35 - 4.50 uIU/mL  Urinalysis, Routine w reflex microscopic (not at [facility])     Status: Abnormal   Collection Time: 05/07/15 10:26 AM  Result Value Ref Range   Color, Urine YELLOW Yellow;Lt. Yellow   APPearance CLEAR Clear   Specific Gravity, Urine 1.015 1.000-1.030   pH 7.0 5.0 - 8.0   Total Protein, Urine NEGATIVE Negative   Urine Glucose NEGATIVE Negative   Ketones, ur NEGATIVE Negative   Bilirubin Urine NEGATIVE Negative   Hgb urine dipstick NEGATIVE Negative   Urobilinogen, UA 0.2 0.0 - 1.0   Leukocytes, UA NEGATIVE Negative   Nitrite NEGATIVE Negative   WBC, UA No formed elements seen on urine microscopic exam. (A) 0-2/hpf  Lipid Profile     Status: None   Collection Time: 05/07/15 10:26 AM  Result Value Ref Range   Cholesterol 144 0 - 200 mg/dL    Comment: ATP III Classification       Desirable:  < 200 mg/dL               Borderline High:  200 - 239 mg/dL          High:  > = 240 mg/dL   Triglycerides 102.0 0.0 - 149.0 mg/dL    Comment: Normal:  <150 mg/dLBorderline High:  150 - 199 mg/dL   HDL 43.60 >39.00 mg/dL   VLDL 20.4 0.0 - 40.0 mg/dL   LDL Cholesterol 80 0 - 99 mg/dL   Total CHOL/HDL Ratio 3     Comment:                Men          Women1/2 Average Risk     3.4          3.3Average Risk          5.0          4.42X Average Risk          9.6          7.13X Average Risk          15.0          11.0                       NonHDL 100.18     Comment: NOTE:  Non-HDL goal should be 30 mg/dL higher than patient's LDL goal (i.e. LDL goal of < 70 mg/dL, would have non-HDL goal of < 100 mg/dL)  Hemoglobin A1c     Status: None   Collection Time:  05/07/15 10:26 AM  Result Value Ref Range   Hgb A1c MFr Bld 5.5 4.6 - 6.5 %    Comment: Glycemic Control Guidelines for People with Diabetes:Non Diabetic:  <6%Goal of Therapy: <7%Additional Action Suggested:  >8%   PSA     Status: None   Collection Time: 05/07/15 10:26 AM  Result Value Ref Range   PSA 2.10 0.10 - 4.00 ng/mL  Uric acid     Status: None   Collection Time: 05/07/15 10:26 AM  Result Value Ref Range   Uric Acid, Serum 6.0 4.0 - 7.8 mg/dL    Assessment/Plan: Visit for preventive health examination Depression screen negative. Health Maintenance reviewed and up-to-date. Preventive schedule discussed and handout given in AVS. Will obtain fasting labs today.   HYPERLIPIDEMIA Will repeat lipid panel today. Continue Crestor as directed.  Gout No recent flares. Will repeat Uric acid level

## 2015-05-08 ENCOUNTER — Encounter: Payer: Self-pay | Admitting: *Deleted

## 2015-05-08 NOTE — Assessment & Plan Note (Signed)
No recent flares. Will repeat Uric acid level

## 2015-05-08 NOTE — Assessment & Plan Note (Signed)
Will repeat lipid panel today. Continue Crestor as directed.

## 2015-05-08 NOTE — Assessment & Plan Note (Signed)
Depression screen negative. Health Maintenance reviewed and up-to-date. Preventive schedule discussed and handout given in AVS. Will obtain fasting labs today.

## 2015-06-19 ENCOUNTER — Other Ambulatory Visit: Payer: Self-pay | Admitting: Physician Assistant

## 2015-06-27 ENCOUNTER — Other Ambulatory Visit: Payer: Self-pay | Admitting: Physician Assistant

## 2015-06-27 NOTE — Telephone Encounter (Signed)
Last filled: 01/15/15 Amt: 30, 5 Last OV:  05/07/15  Med filled.

## 2015-12-15 ENCOUNTER — Other Ambulatory Visit: Payer: Self-pay | Admitting: Physician Assistant

## 2015-12-16 ENCOUNTER — Other Ambulatory Visit: Payer: Self-pay | Admitting: Physician Assistant

## 2015-12-16 NOTE — Telephone Encounter (Signed)
Rx request to pharmacy/SLS  

## 2016-03-10 ENCOUNTER — Other Ambulatory Visit: Payer: Self-pay | Admitting: Physician Assistant

## 2016-04-05 ENCOUNTER — Other Ambulatory Visit: Payer: Self-pay | Admitting: Physician Assistant

## 2016-04-06 NOTE — Telephone Encounter (Signed)
Rx request to pharmacy/SLS Requested drug refills are authorized, however, the patient needs further evaluation and/or laboratory testing before further refills are given. Ask him to make an appointment for this.  Please have patient schedule complete Physical with fasting labs, due after 04/2016, per provider/SLS 0/21 Thanks.

## 2016-04-08 NOTE — Telephone Encounter (Signed)
Left message for patient to call and schedule appointment for fasting CPE.

## 2016-04-27 ENCOUNTER — Other Ambulatory Visit: Payer: Self-pay | Admitting: Physician Assistant

## 2016-05-12 ENCOUNTER — Encounter: Payer: Managed Care, Other (non HMO) | Admitting: Physician Assistant

## 2016-05-15 ENCOUNTER — Encounter: Payer: Self-pay | Admitting: Physician Assistant

## 2016-05-15 ENCOUNTER — Ambulatory Visit (INDEPENDENT_AMBULATORY_CARE_PROVIDER_SITE_OTHER): Payer: Managed Care, Other (non HMO) | Admitting: Physician Assistant

## 2016-05-15 VITALS — BP 110/82 | HR 64 | Temp 98.4°F | Resp 16 | Ht 72.0 in | Wt 219.1 lb

## 2016-05-15 DIAGNOSIS — B070 Plantar wart: Secondary | ICD-10-CM

## 2016-05-15 DIAGNOSIS — J4520 Mild intermittent asthma, uncomplicated: Secondary | ICD-10-CM | POA: Diagnosis not present

## 2016-05-15 DIAGNOSIS — M109 Gout, unspecified: Secondary | ICD-10-CM

## 2016-05-15 DIAGNOSIS — E782 Mixed hyperlipidemia: Secondary | ICD-10-CM | POA: Diagnosis not present

## 2016-05-15 DIAGNOSIS — Z0000 Encounter for general adult medical examination without abnormal findings: Secondary | ICD-10-CM

## 2016-05-15 DIAGNOSIS — Z0001 Encounter for general adult medical examination with abnormal findings: Secondary | ICD-10-CM

## 2016-05-15 LAB — COMPREHENSIVE METABOLIC PANEL
ALK PHOS: 44 U/L (ref 39–117)
ALT: 17 U/L (ref 0–53)
AST: 15 U/L (ref 0–37)
Albumin: 3.9 g/dL (ref 3.5–5.2)
BILIRUBIN TOTAL: 0.8 mg/dL (ref 0.2–1.2)
BUN: 16 mg/dL (ref 6–23)
CO2: 33 meq/L — AB (ref 19–32)
CREATININE: 0.94 mg/dL (ref 0.40–1.50)
Calcium: 9.4 mg/dL (ref 8.4–10.5)
Chloride: 104 mEq/L (ref 96–112)
GFR: 87.22 mL/min (ref >60.00)
GLUCOSE: 91 mg/dL (ref 70–99)
Potassium: 4.8 mEq/L (ref 3.5–5.1)
SODIUM: 140 meq/L (ref 135–145)
TOTAL PROTEIN: 6.6 g/dL (ref 6.0–8.3)

## 2016-05-15 LAB — URINALYSIS, ROUTINE W REFLEX MICROSCOPIC
Bilirubin Urine: NEGATIVE
HGB URINE DIPSTICK: NEGATIVE
Ketones, ur: NEGATIVE
LEUKOCYTES UA: NEGATIVE
NITRITE: NEGATIVE
PH: 5.5 (ref 5.0–8.0)
Specific Gravity, Urine: 1.025 (ref 1.000–1.030)
TOTAL PROTEIN, URINE-UPE24: NEGATIVE
URINE GLUCOSE: NEGATIVE
Urobilinogen, UA: 0.2 (ref 0.0–1.0)

## 2016-05-15 LAB — TSH: TSH: 1.46 u[IU]/mL (ref 0.35–4.50)

## 2016-05-15 LAB — CBC
HCT: 47.3 % (ref 39.0–52.0)
Hemoglobin: 16.2 g/dL (ref 13.0–17.0)
MCHC: 34.2 g/dL (ref 30.0–36.0)
MCV: 95 fl (ref 78.0–100.0)
Platelets: 231 10*3/uL (ref 150.0–400.0)
RBC: 4.97 Mil/uL (ref 4.22–5.81)
RDW: 13 % (ref 11.5–15.5)
WBC: 6.2 10*3/uL (ref 4.0–10.5)

## 2016-05-15 LAB — LIPID PANEL
CHOL/HDL RATIO: 3
CHOLESTEROL: 141 mg/dL (ref 0–200)
HDL: 47.5 mg/dL (ref >39.00)
LDL Cholesterol: 77 mg/dL (ref 0–99)
NonHDL: 93.01
TRIGLYCERIDES: 79 mg/dL (ref 0.0–149.0)
VLDL: 15.8 mg/dL (ref 0.0–40.0)

## 2016-05-15 LAB — PSA: PSA: 1.99 ng/mL (ref 0.10–4.00)

## 2016-05-15 LAB — HEMOGLOBIN A1C: Hgb A1c MFr Bld: 5.6 % (ref 4.6–6.5)

## 2016-05-15 NOTE — Progress Notes (Signed)
Pre visit review using our clinic review tool, if applicable. No additional management support is needed unless otherwise documented below in the visit note/SLS  

## 2016-05-15 NOTE — Patient Instructions (Addendum)
Please go to the lab for blood work.   Our office will call you with your results unless you have chosen to receive results via MyChart.  If your blood work is normal we will follow-up each year for physicals and as scheduled for chronic medical problems.  If anything is abnormal we will treat accordingly and get you in for a follow-up.  Please keep an eye on the wart. If not resolving, please come back in 2 weeks for repeat cryotherapy.  Preventive Care for Adults, Male A healthy lifestyle and preventive care can promote health and wellness. Preventive health guidelines for men include the following key practices:  A routine yearly physical is a good way to check with your health care provider about your health and preventative screening. It is a chance to share any concerns and updates on your health and to receive a thorough exam.  Visit your dentist for a routine exam and preventative care every 6 months. Brush your teeth twice a day and floss once a day. Good oral hygiene prevents tooth decay and gum disease.  The frequency of eye exams is based on your age, health, family medical history, use of contact lenses, and other factors. Follow your health care provider's recommendations for frequency of eye exams.  Eat a healthy diet. Foods such as vegetables, fruits, whole grains, low-fat dairy products, and lean protein foods contain the nutrients you need without too many calories. Decrease your intake of foods high in solid fats, added sugars, and salt. Eat the right amount of calories for you.Get information about a proper diet from your health care provider, if necessary.  Regular physical exercise is one of the most important things you can do for your health. Most adults should get at least 150 minutes of moderate-intensity exercise (any activity that increases your heart rate and causes you to sweat) each week. In addition, most adults need muscle-strengthening exercises on 2 or more  days a week.  Maintain a healthy weight. The body mass index (BMI) is a screening tool to identify possible weight problems. It provides an estimate of body fat based on height and weight. Your health care provider can find your BMI and can help you achieve or maintain a healthy weight.For adults 20 years and older:  A BMI below 18.5 is considered underweight.  A BMI of 18.5 to 24.9 is normal.  A BMI of 25 to 29.9 is considered overweight.  A BMI of 30 and above is considered obese.  Maintain normal blood lipids and cholesterol levels by exercising and minimizing your intake of saturated fat. Eat a balanced diet with plenty of fruit and vegetables. Blood tests for lipids and cholesterol should begin at age 61 and be repeated every 5 years. If your lipid or cholesterol levels are high, you are over 50, or you are at high risk for heart disease, you may need your cholesterol levels checked more frequently.Ongoing high lipid and cholesterol levels should be treated with medicines if diet and exercise are not working.  If you smoke, find out from your health care provider how to quit. If you do not use tobacco, do not start.  Lung cancer screening is recommended for adults aged 31-80 years who are at high risk for developing lung cancer because of a history of smoking. A yearly low-dose CT scan of the lungs is recommended for people who have at least a 30-pack-year history of smoking and are a current smoker or have quit within the past  15 years. A pack year of smoking is smoking an average of 1 pack of cigarettes a day for 1 year (for example: 1 pack a day for 30 years or 2 packs a day for 15 years). Yearly screening should continue until the smoker has stopped smoking for at least 15 years. Yearly screening should be stopped for people who develop a health problem that would prevent them from having lung cancer treatment.  If you choose to drink alcohol, do not have more than 2 drinks per day. One  drink is considered to be 12 ounces (355 mL) of beer, 5 ounces (148 mL) of wine, or 1.5 ounces (44 mL) of liquor.  Avoid use of street drugs. Do not share needles with anyone. Ask for help if you need support or instructions about stopping the use of drugs.  High blood pressure causes heart disease and increases the risk of stroke. Your blood pressure should be checked at least every 1-2 years. Ongoing high blood pressure should be treated with medicines, if weight loss and exercise are not effective.  If you are 54-4 years old, ask your health care provider if you should take aspirin to prevent heart disease.  Diabetes screening is done by taking a blood sample to check your blood glucose level after you have not eaten for a certain period of time (fasting). If you are not overweight and you do not have risk factors for diabetes, you should be screened once every 3 years starting at age 70. If you are overweight or obese and you are 81-15 years of age, you should be screened for diabetes every year as part of your cardiovascular risk assessment.  Colorectal cancer can be detected and often prevented. Most routine colorectal cancer screening begins at the age of 72 and continues through age 67. However, your health care provider may recommend screening at an earlier age if you have risk factors for colon cancer. On a yearly basis, your health care provider may provide home test kits to check for hidden blood in the stool. Use of a small camera at the end of a tube to directly examine the colon (sigmoidoscopy or colonoscopy) can detect the earliest forms of colorectal cancer. Talk to your health care provider about this at age 49, when routine screening begins. Direct exam of the colon should be repeated every 5-10 years through age 51, unless early forms of precancerous polyps or small growths are found.  People who are at an increased risk for hepatitis B should be screened for this virus. You are  considered at high risk for hepatitis B if:  You were born in a country where hepatitis B occurs often. Talk with your health care provider about which countries are considered high risk.  Your parents were born in a high-risk country and you have not received a shot to protect against hepatitis B (hepatitis B vaccine).  You have HIV or AIDS.  You use needles to inject street drugs.  You live with, or have sex with, someone who has hepatitis B.  You are a man who has sex with other men (MSM).  You get hemodialysis treatment.  You take certain medicines for conditions such as cancer, organ transplantation, and autoimmune conditions.  Hepatitis C blood testing is recommended for all people born from [birth year] through 1965 and any individual with known risks for hepatitis C.  Practice safe sex. Use condoms and avoid high-risk sexual practices to reduce the spread of sexually transmitted infections (STIs).  STIs include gonorrhea, chlamydia, syphilis, trichomonas, herpes, HPV, and human immunodeficiency virus (HIV). Herpes, HIV, and HPV are viral illnesses that have no cure. They can result in disability, cancer, and death.  If you are a man who has sex with other men, you should be screened at least once per year for:  HIV.  Urethral, rectal, and pharyngeal infection of gonorrhea, chlamydia, or both.  If you are at risk of being infected with HIV, it is recommended that you take a prescription medicine daily to prevent HIV infection. This is called preexposure prophylaxis (PrEP). You are considered at risk if:  You are a man who has sex with other men (MSM) and have other risk factors.  You are a heterosexual man, are sexually active, and are at increased risk for HIV infection.  You take drugs by injection.  You are sexually active with a partner who has HIV.  Talk with your health care provider about whether you are at high risk of being infected with HIV. If you choose to begin PrEP,  you should first be tested for HIV. You should then be tested every 3 months for as long as you are taking PrEP.  A one-time screening for abdominal aortic aneurysm (AAA) and surgical repair of large AAAs by ultrasound are recommended for men ages 65 to 109 years who are current or former smokers.  Healthy men should no longer receive prostate-specific antigen (PSA) blood tests as part of routine cancer screening. Talk with your health care provider about prostate cancer screening.  Testicular cancer screening is not recommended for adult males who have no symptoms. Screening includes self-exam, a health care provider exam, and other screening tests. Consult with your health care provider about any symptoms you have or any concerns you have about testicular cancer.  Use sunscreen. Apply sunscreen liberally and repeatedly throughout the day. You should seek shade when your shadow is shorter than you. Protect yourself by wearing long sleeves, pants, a wide-brimmed hat, and sunglasses year round, whenever you are outdoors.  Once a month, do a whole-body skin exam, using a mirror to look at the skin on your back. Tell your health care provider about new moles, moles that have irregular borders, moles that are larger than a pencil eraser, or moles that have changed in shape or color.  Stay current with required vaccines (immunizations).  Influenza vaccine. All adults should be immunized every year.  Tetanus, diphtheria, and acellular pertussis (Td, Tdap) vaccine. An adult who has not previously received Tdap or who does not know his vaccine status should receive 1 dose of Tdap. This initial dose should be followed by tetanus and diphtheria toxoids (Td) booster doses every 10 years. Adults with an unknown or incomplete history of completing a 3-dose immunization series with Td-containing vaccines should begin or complete a primary immunization series including a Tdap dose. Adults should receive a Td booster  every 10 years.  Varicella vaccine. An adult without evidence of immunity to varicella should receive 2 doses or a second dose if he has previously received 1 dose.  Human papillomavirus (HPV) vaccine. Males aged 11-21 years who have not received the vaccine previously should receive the 3-dose series. Males aged 22-26 years may be immunized. Immunization is recommended through the age of 18 years for any male who has sex with males and did not get any or all doses earlier. Immunization is recommended for any person with an immunocompromised condition through the age of 37 years if he  did not get any or all doses earlier. During the 3-dose series, the second dose should be obtained 4-8 weeks after the first dose. The third dose should be obtained 24 weeks after the first dose and 16 weeks after the second dose.  Zoster vaccine. One dose is recommended for adults aged 3 years or older unless certain conditions are present.  Measles, mumps, and rubella (MMR) vaccine. Adults born before 45 generally are considered immune to measles and mumps. Adults born in [birth year] or later should have 1 or more doses of MMR vaccine unless there is a contraindication to the vaccine or there is laboratory evidence of immunity to each of the three diseases. A routine second dose of MMR vaccine should be obtained at least 28 days after the first dose for students attending postsecondary schools, health care workers, or international travelers. People who received inactivated measles vaccine or an unknown type of measles vaccine during 1963-1967 should receive 2 doses of MMR vaccine. People who received inactivated mumps vaccine or an unknown type of mumps vaccine before 1979 and are at high risk for mumps infection should consider immunization with 2 doses of MMR vaccine. Unvaccinated health care workers born before 18 who lack laboratory evidence of measles, mumps, or rubella immunity or laboratory confirmation of disease  should consider measles and mumps immunization with 2 doses of MMR vaccine or rubella immunization with 1 dose of MMR vaccine.  Pneumococcal 13-valent conjugate (PCV13) vaccine. When indicated, a person who is uncertain of his immunization history and has no record of immunization should receive the PCV13 vaccine. All adults 32 years of age and older should receive this vaccine. An adult aged 84 years or older who has certain medical conditions and has not been previously immunized should receive 1 dose of PCV13 vaccine. This PCV13 should be followed with a dose of pneumococcal polysaccharide (PPSV23) vaccine. Adults who are at high risk for pneumococcal disease should obtain the PPSV23 vaccine at least 8 weeks after the dose of PCV13 vaccine. Adults older than 59 years of age who have normal immune system function should obtain the PPSV23 vaccine dose at least 1 year after the dose of PCV13 vaccine.  Pneumococcal polysaccharide (PPSV23) vaccine. When PCV13 is also indicated, PCV13 should be obtained first. All adults aged 30 years and older should be immunized. An adult younger than age 31 years who has certain medical conditions should be immunized. Any person who resides in a nursing home or long-term care facility should be immunized. An adult smoker should be immunized. People with an immunocompromised condition and certain other conditions should receive both PCV13 and PPSV23 vaccines. People with human immunodeficiency virus (HIV) infection should be immunized as soon as possible after diagnosis. Immunization during chemotherapy or radiation therapy should be avoided. Routine use of PPSV23 vaccine is not recommended for American Indians, [address], or people younger than 65 years unless there are medical conditions that require PPSV23 vaccine. When indicated, people who have unknown immunization and have no record of immunization should receive PPSV23 vaccine. One-time revaccination 5 years after the  first dose of PPSV23 is recommended for people aged 19-64 years who have chronic kidney failure, nephrotic syndrome, asplenia, or immunocompromised conditions. People who received 1-2 doses of PPSV23 before age 75 years should receive another dose of PPSV23 vaccine at age 35 years or later if at least 5 years have passed since the previous dose. Doses of PPSV23 are not needed for people immunized with PPSV23 at or after  age 26 years.  Meningococcal vaccine. Adults with asplenia or persistent complement component deficiencies should receive 2 doses of quadrivalent meningococcal conjugate (MenACWY-D) vaccine. The doses should be obtained at least 2 months apart. Microbiologists working with certain meningococcal bacteria, [employer] recruits, people at risk during an outbreak, and people who travel to or live in countries with a high rate of meningitis should be immunized. A first-year college student up through age 85 years who is living in a residence hall should receive a dose if he did not receive a dose on or after his 16th birthday. Adults who have certain high-risk conditions should receive one or more doses of vaccine.  Hepatitis A vaccine. Adults who wish to be protected from this disease, have chronic liver disease, work with hepatitis A-infected animals, work in hepatitis A research labs, or travel to or work in countries with a high rate of hepatitis A should be immunized. Adults who were previously unvaccinated and who anticipate close contact with an international adoptee during the first 60 days after arrival in the [place] from a country with a high rate of hepatitis A should be immunized.  Hepatitis B vaccine. Adults should be immunized if they wish to be protected from this disease, are under age 36 years and have diabetes, have chronic liver disease, have had more than one sex partner in the past 6 months, may be exposed to blood or other infectious body fluids, are household contacts or  sex partners of hepatitis B positive people, are clients or workers in certain care facilities, or travel to or work in countries with a high rate of hepatitis B.  Haemophilus influenzae type b (Hib) vaccine. A previously unvaccinated person with asplenia or sickle cell disease or having a scheduled splenectomy should receive 1 dose of Hib vaccine. Regardless of previous immunization, a recipient of a hematopoietic stem cell transplant should receive a 3-dose series 6-12 months after his successful transplant. Hib vaccine is not recommended for adults with HIV infection. Preventive Service / Frequency Ages 64 to 79  Blood pressure check.** / Every 3-5 years.  Lipid and cholesterol check.** / Every 5 years beginning at age 81.  Hepatitis C blood test.** / For any individual with known risks for hepatitis C.  Skin self-exam. / Monthly.  Influenza vaccine. / Every year.  Tetanus, diphtheria, and acellular pertussis (Tdap, Td) vaccine.** / Consult your health care provider. 1 dose of Td every 10 years.  Varicella vaccine.** / Consult your health care provider.  HPV vaccine. / 3 doses over 6 months, if 76 or younger.  Measles, mumps, rubella (MMR) vaccine.** / You need at least 1 dose of MMR if you were born in 1957 or later. You may also need a second dose.  Pneumococcal 13-valent conjugate (PCV13) vaccine.** / Consult your health care provider.  Pneumococcal polysaccharide (PPSV23) vaccine.** / 1 to 2 doses if you smoke cigarettes or if you have certain conditions.  Meningococcal vaccine.** / 1 dose if you are age 66 to 55 years and a [employer] researcher living in a residence hall, or have one of several medical conditions. You may also need additional booster doses.  Hepatitis A vaccine.** / Consult your health care provider.  Hepatitis B vaccine.** / Consult your health care provider.  Haemophilus influenzae type b (Hib) vaccine.** / Consult your health care provider. Ages 78  to 72  Blood pressure check.** / Every year.  Lipid and cholesterol check.** / Every 5 years beginning at age 73.  Lung cancer screening. / Every year if you are aged 33-80 years and have a 30-pack-year history of smoking and currently smoke or have quit within the past 15 years. Yearly screening is stopped once you have quit smoking for at least 15 years or develop a health problem that would prevent you from having lung cancer treatment.  Fecal occult blood test (FOBT) of stool. / Every year beginning at age 4 and continuing until age 39. You may not have to do this test if you get a colonoscopy every 10 years.  Flexible sigmoidoscopy** or colonoscopy.** / Every 5 years for a flexible sigmoidoscopy or every 10 years for a colonoscopy beginning at age 23 and continuing until age 69.  Hepatitis C blood test.** / For all people born from [birth year] through 1965 and any individual with known risks for hepatitis C.  Skin self-exam. / Monthly.  Influenza vaccine. / Every year.  Tetanus, diphtheria, and acellular pertussis (Tdap/Td) vaccine.** / Consult your health care provider. 1 dose of Td every 10 years.  Varicella vaccine.** / Consult your health care provider.  Zoster vaccine.** / 1 dose for adults aged 33 years or older.  Measles, mumps, rubella (MMR) vaccine.** / You need at least 1 dose of MMR if you were born in 1957 or later. You may also need a second dose.  Pneumococcal 13-valent conjugate (PCV13) vaccine.** / Consult your health care provider.  Pneumococcal polysaccharide (PPSV23) vaccine.** / 1 to 2 doses if you smoke cigarettes or if you have certain conditions.  Meningococcal vaccine.** / Consult your health care provider.  Hepatitis A vaccine.** / Consult your health care provider.  Hepatitis B vaccine.** / Consult your health care provider.  Haemophilus influenzae type b (Hib) vaccine.** / Consult your health care provider. Ages 66 and over  Blood pressure check.** /  Every year.  Lipid and cholesterol check.**/ Every 5 years beginning at age 30.  Lung cancer screening. / Every year if you are aged 76-80 years and have a 30-pack-year history of smoking and currently smoke or have quit within the past 15 years. Yearly screening is stopped once you have quit smoking for at least 15 years or develop a health problem that would prevent you from having lung cancer treatment.  Fecal occult blood test (FOBT) of stool. / Every year beginning at age 71 and continuing until age 8. You may not have to do this test if you get a colonoscopy every 10 years.  Flexible sigmoidoscopy** or colonoscopy.** / Every 5 years for a flexible sigmoidoscopy or every 10 years for a colonoscopy beginning at age 66 and continuing until age 25.  Hepatitis C blood test.** / For all people born from [birth year] through 1964 and any individual with known risks for hepatitis C.  Abdominal aortic aneurysm (AAA) screening.** / A one-time screening for ages 3 to 61 years who are current or former smokers.  Skin self-exam. / Monthly.  Influenza vaccine. / Every year.  Tetanus, diphtheria, and acellular pertussis (Tdap/Td) vaccine.** / 1 dose of Td every 10 years.  Varicella vaccine.** / Consult your health care provider.  Zoster vaccine.** / 1 dose for adults aged 37 years or older.  Pneumococcal 13-valent conjugate (PCV13) vaccine.** / 1 dose for all adults aged 29 years and older.  Pneumococcal polysaccharide (PPSV23) vaccine.** / 1 dose for all adults aged 48 years and older.  Meningococcal vaccine.** / Consult your health care provider.  Hepatitis A vaccine.** / Consult your health care provider.  Hepatitis  B vaccine.** / Consult your health care provider.  Haemophilus influenzae type b (Hib) vaccine.** / Consult your health care provider. **Family history and personal history of risk and conditions may change your health care provider's recommendations.   This information is not  intended to replace advice given to you by your health care provider. Make sure you discuss any questions you have with your health care provider.   Document Released: 09/29/2001 Document Revised: 08/24/2014 Document Reviewed: 12/29/2010 Elsevier Interactive Patient Education [employer].
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
## 2016-06-15 ENCOUNTER — Other Ambulatory Visit: Payer: Self-pay | Admitting: Physician Assistant

## 2016-06-15 NOTE — Telephone Encounter (Signed)
Rx request to pharmacy/SLS  

## 2016-06-19 ENCOUNTER — Other Ambulatory Visit: Payer: Self-pay | Admitting: Physician Assistant

## 2016-06-19 ENCOUNTER — Other Ambulatory Visit: Payer: Self-pay | Admitting: *Deleted

## 2016-06-19 MED ORDER — ALBUTEROL SULFATE HFA 108 (90 BASE) MCG/ACT IN AERS: INHALATION_SPRAY | RESPIRATORY_TRACT | 0 refills | Status: DC

## 2016-06-19 NOTE — Telephone Encounter (Signed)
Rx request to pharmacy/SLS  

## 2016-06-19 NOTE — Progress Notes (Signed)
90-day supply to pharmacy as requested/SLS 11/03

## 2016-06-27 ENCOUNTER — Other Ambulatory Visit: Payer: Self-pay | Admitting: Physician Assistant

## 2016-09-26 ENCOUNTER — Other Ambulatory Visit: Payer: Self-pay | Admitting: Physician Assistant

## 2016-10-11 ENCOUNTER — Other Ambulatory Visit: Payer: Self-pay | Admitting: Physician Assistant

## 2016-10-12 ENCOUNTER — Other Ambulatory Visit: Payer: Self-pay | Admitting: Physician Assistant

## 2016-10-22 ENCOUNTER — Other Ambulatory Visit: Payer: Self-pay | Admitting: Physician Assistant

## 2016-12-24 ENCOUNTER — Other Ambulatory Visit: Payer: Self-pay | Admitting: Physician Assistant

## 2016-12-24 ENCOUNTER — Encounter: Payer: Self-pay | Admitting: Emergency Medicine

## 2016-12-29 ENCOUNTER — Other Ambulatory Visit: Payer: Self-pay | Admitting: Physician Assistant

## 2017-01-05 ENCOUNTER — Encounter: Payer: Self-pay | Admitting: Osteopathic Medicine

## 2017-01-05 ENCOUNTER — Ambulatory Visit (INDEPENDENT_AMBULATORY_CARE_PROVIDER_SITE_OTHER): Payer: Managed Care, Other (non HMO) | Admitting: Osteopathic Medicine

## 2017-01-05 VITALS — BP 127/80 | HR 67 | Ht 71.0 in | Wt 223.0 lb

## 2017-01-05 DIAGNOSIS — B356 Tinea cruris: Secondary | ICD-10-CM | POA: Diagnosis not present

## 2017-01-05 DIAGNOSIS — J4520 Mild intermittent asthma, uncomplicated: Secondary | ICD-10-CM

## 2017-01-05 DIAGNOSIS — M109 Gout, unspecified: Secondary | ICD-10-CM | POA: Diagnosis not present

## 2017-01-05 DIAGNOSIS — J3089 Other allergic rhinitis: Secondary | ICD-10-CM

## 2017-01-05 DIAGNOSIS — E785 Hyperlipidemia, unspecified: Secondary | ICD-10-CM

## 2017-01-05 LAB — LIPID PANEL
Cholesterol: 271 mg/dL — ABNORMAL HIGH (ref <200)
HDL: 36 mg/dL — ABNORMAL LOW (ref >40)
LDL Cholesterol: 205 mg/dL — ABNORMAL HIGH (ref <100)
Total CHOL/HDL Ratio: 7.5 Ratio — ABNORMAL HIGH (ref <5.0)
Triglycerides: 148 mg/dL (ref <150)
VLDL: 30 mg/dL (ref <30)

## 2017-01-05 LAB — CBC
HEMATOCRIT: 47.3 % (ref 38.5–50.0)
HEMOGLOBIN: 16.2 g/dL (ref 13.2–17.1)
MCH: 32.5 pg (ref 27.0–33.0)
MCHC: 34.2 g/dL (ref 32.0–36.0)
MCV: 95 fL (ref 80.0–100.0)
MPV: 9.3 fL (ref 7.5–12.5)
Platelets: 239 10*3/uL (ref 140–400)
RBC: 4.98 MIL/uL (ref 4.20–5.80)
RDW: 13.2 % (ref 11.0–15.0)
WBC: 5.1 10*3/uL (ref 3.8–10.8)

## 2017-01-05 LAB — COMPLETE METABOLIC PANEL WITH GFR
ALT: 16 U/L (ref 9–46)
AST: 15 U/L (ref 10–35)
Albumin: 4.2 g/dL (ref 3.6–5.1)
Alkaline Phosphatase: 49 U/L (ref 40–115)
BUN: 19 mg/dL (ref 7–25)
CALCIUM: 9.7 mg/dL (ref 8.6–10.3)
CHLORIDE: 103 mmol/L (ref 98–110)
CO2: 24 mmol/L (ref 20–31)
Creat: 1.02 mg/dL (ref 0.70–1.33)
GFR, Est Non African American: 80 mL/min (ref >=60)
Glucose, Bld: 90 mg/dL (ref 65–99)
POTASSIUM: 4.5 mmol/L (ref 3.5–5.3)
Sodium: 139 mmol/L (ref 135–146)
Total Bilirubin: 0.7 mg/dL (ref 0.2–1.2)
Total Protein: 6.6 g/dL (ref 6.1–8.1)

## 2017-01-05 LAB — URIC ACID: Uric Acid, Serum: 7 mg/dL (ref 4.0–8.0)

## 2017-01-05 MED ORDER — TERBINAFINE HCL 250 MG PO TABS: 250.0000 mg | ORAL_TABLET | Freq: Every day | ORAL | 1 refills | Status: DC

## 2017-01-05 MED ORDER — LORATADINE 10 MG PO TABS: 5.0000 mg | ORAL_TABLET | Freq: Every day | ORAL | 2 refills | Status: DC | PRN

## 2017-01-05 MED ORDER — ALLOPURINOL 100 MG PO TABS: 100.0000 mg | ORAL_TABLET | Freq: Every day | ORAL | 3 refills | Status: DC

## 2017-01-05 MED ORDER — ALBUTEROL SULFATE HFA 108 (90 BASE) MCG/ACT IN AERS: INHALATION_SPRAY | RESPIRATORY_TRACT | 3 refills | Status: DC

## 2017-01-05 MED ORDER — MONTELUKAST SODIUM 10 MG PO TABS: 10.0000 mg | ORAL_TABLET | Freq: Every day | ORAL | 3 refills | Status: DC

## 2017-01-05 NOTE — Patient Instructions (Signed)
Plan:  Labs to check cholesterol off the medications, uric acid levels for gout control, liver/kidney function prior to starting antifungal medication  Antifungal medication for 4-6 weeks or longer if needed - let me know if rash isn't better after 6 weeks  Plan to come back for annual physical in 04/2017 when due

## 2017-01-05 NOTE — Progress Notes (Addendum)
HPI: David Melendez is a 61 y.o. male  who presents to [facility] today, 01/05/17,  for chief complaint of:  Chief Complaint  Patient presents with  . Establish Care    medication refills    New patient here to establish care. Needs refills on some of his medications.  History of asthma: Does pretty well with as needed albuterol, complicated by seasonal allergies, he does not consistently take Flonase, he usually will take Singulair and second-generation antihistamine. Reports some increased sinus congestion/runny nose lately since pollen counts have been high  Hyperlipidemia: Has not been on Crestor, thinks he is feeling a bit more energetic since stopping this medication a few months ago. Would like to recheck cholesterol levels and see if he really needs to be on this medication.  Gout: Last flare was some time ago, most recent uric acid level checked greater than 1 year ago and was not at goal.  Rash: Patient reports rash in area of lower abdomen and groin where his bike shorts accumulate some sweat. Not particularly itchy but rash has been bothering him.   Past medical, surgical, social and family history reviewed: Patient Active Problem List   Diagnosis Date Noted  . Visit for preventive health examination 12/28/2013  . Prostate cancer screening 12/28/2013  . Benign neoplasm of colon 04/23/2013  . Unspecified vitamin D deficiency 04/05/2013  . DD (diverticular disease) 01/12/2012  . Gout 12/06/2007  . Asthma 12/06/2007  . HYPERLIPIDEMIA 05/27/2007   Past Surgical History:  Procedure Laterality Date  . ANTERIOR CRUCIATE LIGAMENT REPAIR     right knee  . COLONOSCOPY  2011   [place] , [place]  . SIGMOIDOSCOPY     age 69  . TONSILLECTOMY    . WISDOM TOOTH EXTRACTION     Social History  Substance Use Topics  . Smoking status: Former Smoker    Quit date: 08/17/1980  . Smokeless tobacco: Never Used     Comment: 2 years in his 53s , < 1  ppd  . Alcohol use 8.4 oz/week    14 Cans of beer per week   Family History  Problem Relation Age of Onset  . Diabetes Mother        AODM, non IDDM  . Hyperlipidemia Mother        Living  . Colon polyps Mother   . Lung cancer Father 31       smoker; Deceased  . Colon polyps Brother   . Gout Brother   . Stroke Paternal Grandmother        in 68s  . Heart attack Paternal Uncle        in 52s  . Healthy Brother        #2  . Healthy Sister        x1     Current medication list and allergy/intolerance information reviewed:   Current Outpatient Prescriptions  Medication Sig Dispense Refill  . albuterol (PROAIR HFA) 108 (90 Base) MCG/ACT inhaler TAKE 1 TO 2 PUFFS EVERY 4 HOURS AS NEEDED 3 Inhaler 0  . allopurinol (ZYLOPRIM) 100 MG tablet TAKE 1 TABLET (100 MG TOTAL) BY MOUTH DAILY. 30 tablet 0  . fluticasone (FLONASE) 50 MCG/ACT nasal spray PLACE 2 SPRAYS INTO BOTH NOSTRILS DAILY. 16 g 2  . loratadine (CLARITIN) 10 MG tablet Take 0.5-1 tablets (5-10 mg total) by mouth daily as needed for allergies. 30 tablet 2  . montelukast (SINGULAIR) 10 MG tablet TAKE 1 TABLET  BY MOUTH AT BEDTIME 30 tablet 0  . Multiple Vitamin (MULTIVITAMIN) tablet Take 1 tablet by mouth daily.      . rosuvastatin (CRESTOR) 20 MG tablet TAKE 1 TABLET (20 MG TOTAL) BY MOUTH DAILY. (Patient not taking: Reported on 01/05/2017) 90 tablet 1   No current facility-administered medications for this visit.    Allergies  Allergen Reactions  . Indomethacin     Mental status changes      Review of Systems:  Constitutional:  No  fever, no chills, No recent illness,  HEENT: No  headache, no vision change  Cardiac: No  chest pain, No  pressure, No palpitations, No  Orthopnea  Respiratory:  No  shortness of breath.  Gastrointestinal: No  abdominal pain, No  nausea  Musculoskeletal: No new myalgia/arthralgia  Skin: No  Rash  Endocrine: No cold intolerance,  No heat intolerance.   Neurologic: No  weakness, No   dizziness  Psychiatric: No  concerns with depression, No  concerns with anxiety, No sleep problems, No mood problems  Exam:  BP (!) 145/84   Pulse 72   Ht 5\' 11"  (1.803 m)   Wt 223 lb (101.2 kg)   BMI 31.10 kg/m   Constitutional: VS see above. General Appearance: alert, well-developed, well-nourished, NAD  Eyes: Normal lids and conjunctive, non-icteric sclera  Ears, Nose, Mouth, Throat: MMM, Normal external inspection ears/nares/mouth/lips/gums  Neck: No masses, trachea midline. No thyroid enlargement.  Respiratory: Normal respiratory effort. no wheeze, no rhonchi, no rales  Cardiovascular: S1/S2 normal, no murmur, no rub/gallop auscultated. RRR. No lower extremity edema.   Musculoskeletal: Gait normal. No clubbing/cyanosis of digits.   Neurological: Normal balance/coordination. No tremor  Skin: Rash consistent with tinea, frontline of pants, groin not examined  Psychiatric: Normal judgment/insight. Normal mood and affect. Oriented x3.     ASSESSMENT/PLAN:   Hyperlipidemia, unspecified hyperlipidemia type - Risks versus benefits of statin medication discussed, we'll get levels of cholesterol - Plan: Lipid panel  Gout, unspecified cause, unspecified chronicity, unspecified site - Get uric acid levels, consider adjustment allopurinol based on these - Plan: Uric acid, allopurinol (ZYLOPRIM) 100 MG tablet, DISCONTINUED: allopurinol (ZYLOPRIM) 100 MG tablet  Tinea cruris - Plan: COMPLETE METABOLIC PANEL WITH GFR, CBC, terbinafine (LAMISIL) 250 MG tablet  Mild intermittent asthma without complication - Plan: albuterol (PROAIR HFA) 108 (90 Base) MCG/ACT inhaler, montelukast (SINGULAIR) 10 MG tablet  Seasonal allergic rhinitis due to other allergic trigger - Plan: loratadine (CLARITIN) 10 MG tablet, montelukast (SINGULAIR) 10 MG tablet    Patient Instructions  Plan:  Labs to check cholesterol off the medications, uric acid levels for gout control, liver/kidney function prior  to starting antifungal medication  Antifungal medication for 4-6 weeks or longer if needed - let me know if rash isn't better after 6 weeks  Plan to come back for annual physical in 04/2017 when due   Addendum: Cholesterol levels put him at 10 year ASCVD risk of 13.3%, unable to tolerate Crestor in the past, can try pravastatin. Uric acid levels high, can increase allopurinol. Plan to recheck labs in 3 months  Visit summary with medication list and pertinent instructions was printed for patient to review. All questions at time of visit were answered - patient instructed to contact office with any additional concerns. ER/RTC precautions were reviewed with the patient. Follow-up plan: Return for annual when due .

## 2017-01-07 ENCOUNTER — Other Ambulatory Visit: Payer: Self-pay | Admitting: Physician Assistant

## 2017-01-07 MED ORDER — ALLOPURINOL 100 MG PO TABS: 200.0000 mg | ORAL_TABLET | Freq: Every day | ORAL | 3 refills | Status: DC

## 2017-01-07 MED ORDER — PRAVASTATIN SODIUM 40 MG PO TABS: 20.0000 mg | ORAL_TABLET | Freq: Every day | ORAL | 0 refills | Status: DC

## 2017-01-07 NOTE — Addendum Note (Signed)
Addended by: Maryla Morrow on: 01/07/2017 08:36 AM   Modules accepted: Orders

## 2017-04-02 ENCOUNTER — Other Ambulatory Visit: Payer: Self-pay

## 2017-04-02 DIAGNOSIS — M109 Gout, unspecified: Secondary | ICD-10-CM

## 2017-04-02 MED ORDER — ALLOPURINOL 100 MG PO TABS: 200.0000 mg | ORAL_TABLET | Freq: Every day | ORAL | 3 refills | Status: DC

## 2017-04-27 ENCOUNTER — Other Ambulatory Visit: Payer: Self-pay | Admitting: Osteopathic Medicine

## 2017-04-27 DIAGNOSIS — B356 Tinea cruris: Secondary | ICD-10-CM

## 2017-05-18 ENCOUNTER — Ambulatory Visit (INDEPENDENT_AMBULATORY_CARE_PROVIDER_SITE_OTHER): Payer: Managed Care, Other (non HMO) | Admitting: Osteopathic Medicine

## 2017-05-18 ENCOUNTER — Encounter: Payer: Self-pay | Admitting: Osteopathic Medicine

## 2017-05-18 VITALS — BP 127/70 | HR 56 | Ht 71.0 in | Wt 229.0 lb

## 2017-05-18 DIAGNOSIS — Z23 Encounter for immunization: Secondary | ICD-10-CM | POA: Diagnosis not present

## 2017-05-18 DIAGNOSIS — J4520 Mild intermittent asthma, uncomplicated: Secondary | ICD-10-CM

## 2017-05-18 DIAGNOSIS — Z0000 Encounter for general adult medical examination without abnormal findings: Secondary | ICD-10-CM

## 2017-05-18 DIAGNOSIS — M109 Gout, unspecified: Secondary | ICD-10-CM | POA: Diagnosis not present

## 2017-05-18 DIAGNOSIS — Z1159 Encounter for screening for other viral diseases: Secondary | ICD-10-CM | POA: Diagnosis not present

## 2017-05-18 DIAGNOSIS — K5790 Diverticulosis of intestine, part unspecified, without perforation or abscess without bleeding: Secondary | ICD-10-CM | POA: Diagnosis not present

## 2017-05-18 DIAGNOSIS — Z125 Encounter for screening for malignant neoplasm of prostate: Secondary | ICD-10-CM

## 2017-05-18 DIAGNOSIS — D126 Benign neoplasm of colon, unspecified: Secondary | ICD-10-CM | POA: Diagnosis not present

## 2017-05-18 DIAGNOSIS — E559 Vitamin D deficiency, unspecified: Secondary | ICD-10-CM

## 2017-05-18 DIAGNOSIS — E782 Mixed hyperlipidemia: Secondary | ICD-10-CM

## 2017-05-18 LAB — COMPLETE METABOLIC PANEL WITH GFR
AG Ratio: 1.8 (calc) (ref 1.0–2.5)
ALKALINE PHOSPHATASE (APISO): 48 U/L (ref 40–115)
ALT: 25 U/L (ref 9–46)
AST: 21 U/L (ref 10–35)
Albumin: 4.2 g/dL (ref 3.6–5.1)
BUN: 15 mg/dL (ref 7–25)
CALCIUM: 9.5 mg/dL (ref 8.6–10.3)
CO2: 28 mmol/L (ref 20–32)
CREATININE: 1.02 mg/dL (ref 0.70–1.25)
Chloride: 103 mmol/L (ref 98–110)
GFR, Est African American: 92 mL/min/{1.73_m2} (ref >=60)
GFR, Est Non African American: 80 mL/min/{1.73_m2} (ref >=60)
GLOBULIN: 2.4 g/dL (ref 1.9–3.7)
GLUCOSE: 96 mg/dL (ref 65–99)
Potassium: 4.5 mmol/L (ref 3.5–5.3)
SODIUM: 140 mmol/L (ref 135–146)
Total Bilirubin: 0.4 mg/dL (ref 0.2–1.2)
Total Protein: 6.6 g/dL (ref 6.1–8.1)

## 2017-05-18 LAB — URIC ACID: URIC ACID, SERUM: 6.5 mg/dL (ref 4.0–8.0)

## 2017-05-18 LAB — LIPID PANEL
CHOLESTEROL: 223 mg/dL — AB (ref <200)
HDL: 42 mg/dL (ref >40)
LDL CHOLESTEROL (CALC): 155 mg/dL — AB
NON-HDL CHOLESTEROL (CALC): 181 mg/dL — AB (ref <130)
Total CHOL/HDL Ratio: 5.3 (calc) — ABNORMAL HIGH (ref <5.0)
Triglycerides: 133 mg/dL (ref <150)
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
## 2017-05-18 NOTE — Patient Instructions (Signed)
Plan:  Shingles shot today, second shot in 2-6 months (nurse visit)  Labs today to recheck Cholesterol and Uric Acid  Try Flonase +/- Allegra for allergies, I sent Atrovent nasal spray for cold/flu sinus type issues, see below for other OTC treatments   Over-the-Counter Medications & Home Remedies for Upper Respiratory Illness  Note: the following list assumes no pregnancy, normal liver & kidney function and no other drug interactions. Dr. Sheppard Coil has highlighted medications which are safe for you to use, but these may not be appropriate for everyone. Always ask a pharmacist or qualified medical provider if you have any questions!   Aches/Pains, Fever, Headache Acetaminophen (Tylenol) 500 mg tablets - take max 2 tablets (1000 mg) every 6 hours (4 times per day)  Ibuprofen (Motrin) 200 mg tablets - take max 4 tablets (800 mg) every 6 hours*  Sinus Congestion Prescription Atrovent as directed Cromolyn Nasal Spray (NasalCrom) 1 spray each nostril 3-4 times per day, max 6 imes per day Nasal Saline if desired Oxymetolazone (Afrin, others) sparing use due to rebound congestion, NEVER use in kids Phenylephrine (Sudafed) 10 mg tablets every 4 hours (or the 12-hour formulation)* Diphenhydramine (Benadryl) 25 mg tablets - take max 2 tablets every 4 hours  Cough & Sore Throat Prescription cough pills or syrups as directed Dextromethorphan (Robitussin, others) - cough suppressant Guaifenesin (Robitussin, Mucinex, others) - expectorant (helps cough up mucus) (Dextromethorphan and Guaifenesin also come in a combination tablet) Lozenges w/ Benzocaine + Menthol (Cepacol) Honey - as much as you want! Teas which "coat the throat" - look for ingredients Elm Bark, Licorice Root, Marshmallow Root  Other Antibiotics if these are prescribed - take ALL, even if you're feeling better  Zinc Lozenges within 24 hours of symptoms onset - mixed evidence this shortens the duration of the common cold Don't  waste your money on Vitamin C or Echinacea  *Caution in patients with high blood pressure

## 2017-05-19 LAB — HEPATITIS C ANTIBODY
Hepatitis C Ab: NONREACTIVE
SIGNAL TO CUT-OFF: 0.01 (ref <1.00)

## 2017-05-19 LAB — PSA, TOTAL WITH REFLEX TO PSA, FREE: PSA, Total: 1.4 ng/mL (ref <=4.0)

## 2017-05-20 MED ORDER — ALLOPURINOL 300 MG PO TABS: 300.0000 mg | ORAL_TABLET | Freq: Every day | ORAL | 1 refills | Status: DC

## 2017-05-20 NOTE — Addendum Note (Signed)
Addended by: Maryla Morrow on: 05/20/2017 09:45 AM   Modules accepted: Orders

## 2017-06-12 ENCOUNTER — Other Ambulatory Visit: Payer: Self-pay | Admitting: Osteopathic Medicine

## 2017-06-23 ENCOUNTER — Other Ambulatory Visit: Payer: Self-pay | Admitting: Osteopathic Medicine

## 2017-06-23 DIAGNOSIS — B356 Tinea cruris: Secondary | ICD-10-CM

## 2017-06-30 LAB — HM COLONOSCOPY

## 2017-07-12 ENCOUNTER — Telehealth: Payer: Self-pay

## 2017-07-12 MED ORDER — PRAVASTATIN SODIUM 40 MG PO TABS: 40.0000 mg | ORAL_TABLET | Freq: Every day | ORAL | 0 refills | Status: DC

## 2017-07-12 NOTE — Telephone Encounter (Signed)
Pt called and stated that he needs a refill of his pravastatin because Dr. Sheppard Coil changed his prescription from 1/2 tablet to 1 tablet daily. Rx was sent to pharm per Dr. Sheppard Coil. Pt informed.

## 2017-07-15 ENCOUNTER — Other Ambulatory Visit: Payer: Self-pay | Admitting: Osteopathic Medicine

## 2017-07-15 DIAGNOSIS — B356 Tinea cruris: Secondary | ICD-10-CM

## 2017-07-19 ENCOUNTER — Ambulatory Visit (INDEPENDENT_AMBULATORY_CARE_PROVIDER_SITE_OTHER): Payer: Managed Care, Other (non HMO) | Admitting: Osteopathic Medicine

## 2017-07-19 VITALS — BP 122/63 | HR 70 | Temp 98.6°F

## 2017-07-19 DIAGNOSIS — Z23 Encounter for immunization: Secondary | ICD-10-CM

## 2017-07-19 NOTE — Progress Notes (Signed)
   Subjective:    Patient ID: David Melendez, male    DOB: 06/21/1956, 61 y.o.   MRN: [id number]  HPI  Amin is here for a shingles vaccine. Denies problems with vaccines in the past.   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 He reports the Lamisil was denied for a refill.   Review of Systems     Objective:   Physical Exam        Assessment & Plan:  Immunization- Patient tolerated injection well without complications.   Rash - Patient advised to schedule an appointment for further recommendations on the rash.

## 2017-07-21 ENCOUNTER — Ambulatory Visit (INDEPENDENT_AMBULATORY_CARE_PROVIDER_SITE_OTHER): Payer: Managed Care, Other (non HMO) | Admitting: Osteopathic Medicine

## 2017-07-21 ENCOUNTER — Ambulatory Visit (INDEPENDENT_AMBULATORY_CARE_PROVIDER_SITE_OTHER): Payer: Managed Care, Other (non HMO)

## 2017-07-21 ENCOUNTER — Encounter: Payer: Self-pay | Admitting: Osteopathic Medicine

## 2017-07-21 VITALS — BP 120/76 | HR 68 | Temp 98.1°F | Resp 16 | Ht 71.0 in | Wt 227.6 lb

## 2017-07-21 DIAGNOSIS — M545 Low back pain, unspecified: Secondary | ICD-10-CM

## 2017-07-21 DIAGNOSIS — B356 Tinea cruris: Secondary | ICD-10-CM

## 2017-07-21 DIAGNOSIS — M25551 Pain in right hip: Secondary | ICD-10-CM

## 2017-07-21 MED ORDER — PREDNISONE 10 MG (48) PO TBPK: ORAL_TABLET | Freq: Every day | ORAL | 0 refills | Status: DC

## 2017-07-21 MED ORDER — MELOXICAM 15 MG PO TABS: 15.0000 mg | ORAL_TABLET | Freq: Every day | ORAL | 0 refills | Status: DC

## 2017-07-21 MED ORDER — CYCLOBENZAPRINE HCL 10 MG PO TABS: ORAL_TABLET | ORAL | 0 refills | Status: DC

## 2017-07-21 MED ORDER — TERBINAFINE HCL 250 MG PO TABS: 250.0000 mg | ORAL_TABLET | Freq: Every day | ORAL | 0 refills | Status: DC
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
## 2017-07-21 NOTE — Patient Instructions (Signed)
If hip/back is not better or if it gets worse, I would recommend follow-up with one of our sports medicine specialists (Dr Georgina Snell or Dr. Patton Salles Dr. Darene Lamer) for further evaluation in 2-4 weeks. Just call our office and ask for an appointment for sports medicine!   If you'd like a referral to physical therapy for the back, let me know, can try home exercises first   If rash is no better, will send to dermatology for second opinion.

## 2017-07-22 LAB — CBC
HEMATOCRIT: 46.6 % (ref 38.5–50.0)
Hemoglobin: 16.3 g/dL (ref 13.2–17.1)
MCH: 33 pg (ref 27.0–33.0)
MCHC: 35 g/dL (ref 32.0–36.0)
MCV: 94.3 fL (ref 80.0–100.0)
MPV: 9.5 fL (ref 7.5–12.5)
PLATELETS: 239 10*3/uL (ref 140–400)
RBC: 4.94 10*6/uL (ref 4.20–5.80)
RDW: 12.5 % (ref 11.0–15.0)
WBC: 6.9 10*3/uL (ref 3.8–10.8)

## 2017-07-22 LAB — COMPLETE METABOLIC PANEL WITH GFR
AG RATIO: 1.7 (calc) (ref 1.0–2.5)
ALKALINE PHOSPHATASE (APISO): 54 U/L (ref 40–115)
ALT: 19 U/L (ref 9–46)
AST: 19 U/L (ref 10–35)
Albumin: 4.3 g/dL (ref 3.6–5.1)
BUN: 12 mg/dL (ref 7–25)
CO2: 26 mmol/L (ref 20–32)
Calcium: 9.8 mg/dL (ref 8.6–10.3)
Chloride: 102 mmol/L (ref 98–110)
Creat: 0.92 mg/dL (ref 0.70–1.25)
GFR, Est African American: 104 mL/min/{1.73_m2} (ref >=60)
GFR, Est Non African American: 90 mL/min/{1.73_m2} (ref >=60)
GLOBULIN: 2.6 g/dL (ref 1.9–3.7)
Glucose, Bld: 88 mg/dL (ref 65–99)
POTASSIUM: 4.8 mmol/L (ref 3.5–5.3)
SODIUM: 139 mmol/L (ref 135–146)
Total Bilirubin: 0.4 mg/dL (ref 0.2–1.2)
Total Protein: 6.9 g/dL (ref 6.1–8.1)

## 2017-08-02 ENCOUNTER — Encounter: Payer: Self-pay | Admitting: *Deleted

## 2017-08-17 ENCOUNTER — Other Ambulatory Visit: Payer: Self-pay | Admitting: Osteopathic Medicine

## 2017-08-17 DIAGNOSIS — M545 Low back pain, unspecified: Secondary | ICD-10-CM

## 2017-08-17 DIAGNOSIS — M25551 Pain in right hip: Secondary | ICD-10-CM

## 2017-09-13 ENCOUNTER — Telehealth: Payer: Self-pay

## 2017-09-13 ENCOUNTER — Other Ambulatory Visit: Payer: Self-pay

## 2017-09-13 DIAGNOSIS — M545 Low back pain, unspecified: Secondary | ICD-10-CM

## 2017-09-13 DIAGNOSIS — M25551 Pain in right hip: Secondary | ICD-10-CM

## 2017-09-13 MED ORDER — MELOXICAM 15 MG PO TABS: 15.0000 mg | ORAL_TABLET | Freq: Every day | ORAL | 0 refills | Status: DC

## 2017-09-13 NOTE — Telephone Encounter (Signed)
Open in error

## 2017-10-12 ENCOUNTER — Ambulatory Visit (INDEPENDENT_AMBULATORY_CARE_PROVIDER_SITE_OTHER): Payer: Managed Care, Other (non HMO)

## 2017-10-12 ENCOUNTER — Encounter: Payer: Self-pay | Admitting: Osteopathic Medicine

## 2017-10-12 ENCOUNTER — Ambulatory Visit (INDEPENDENT_AMBULATORY_CARE_PROVIDER_SITE_OTHER): Payer: Managed Care, Other (non HMO) | Admitting: Osteopathic Medicine

## 2017-10-12 VITALS — BP 137/85 | HR 72 | Temp 98.2°F | Wt 227.2 lb

## 2017-10-12 DIAGNOSIS — J4521 Mild intermittent asthma with (acute) exacerbation: Secondary | ICD-10-CM | POA: Diagnosis not present

## 2017-10-12 DIAGNOSIS — R05 Cough: Secondary | ICD-10-CM

## 2017-10-12 DIAGNOSIS — J209 Acute bronchitis, unspecified: Secondary | ICD-10-CM

## 2017-10-12 DIAGNOSIS — R059 Cough, unspecified: Secondary | ICD-10-CM

## 2017-10-12 DIAGNOSIS — R062 Wheezing: Secondary | ICD-10-CM

## 2017-10-12 MED ORDER — AZITHROMYCIN 250 MG PO TABS: ORAL_TABLET | ORAL | 0 refills | Status: DC

## 2017-10-12 MED ORDER — PREDNISONE 20 MG PO TABS: 20.0000 mg | ORAL_TABLET | Freq: Two times a day (BID) | ORAL | 0 refills | Status: DC

## 2017-10-12 MED ORDER — IPRATROPIUM-ALBUTEROL 0.5-2.5 (3) MG/3ML IN SOLN: 3.0000 mL | Freq: Four times a day (QID) | RESPIRATORY_TRACT | Status: DC

## 2017-10-12 NOTE — Patient Instructions (Addendum)
I think bronchitis explains your symptoms. Continue albuterol inhaler few times per day while sick, but what will really help is steroids burst to decrease asthma-related inflammation and mucus. Antibiotics are probably overkill, but take these if no better with the steroids and increased inhaler use.
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
## 2017-10-21 ENCOUNTER — Other Ambulatory Visit: Payer: Self-pay | Admitting: Osteopathic Medicine

## 2017-10-21 DIAGNOSIS — M25551 Pain in right hip: Secondary | ICD-10-CM

## 2017-10-21 DIAGNOSIS — M545 Low back pain, unspecified: Secondary | ICD-10-CM

## 2017-11-03 ENCOUNTER — Other Ambulatory Visit: Payer: Self-pay | Admitting: Physician Assistant

## 2017-12-27 ENCOUNTER — Other Ambulatory Visit: Payer: Self-pay | Admitting: Osteopathic Medicine

## 2017-12-27 DIAGNOSIS — M109 Gout, unspecified: Secondary | ICD-10-CM

## 2018-01-02 ENCOUNTER — Other Ambulatory Visit: Payer: Self-pay | Admitting: Osteopathic Medicine

## 2018-01-02 DIAGNOSIS — J4520 Mild intermittent asthma, uncomplicated: Secondary | ICD-10-CM

## 2018-01-02 DIAGNOSIS — J3089 Other allergic rhinitis: Secondary | ICD-10-CM

## 2018-01-07 ENCOUNTER — Other Ambulatory Visit: Payer: Self-pay | Admitting: Osteopathic Medicine

## 2018-01-07 DIAGNOSIS — J4520 Mild intermittent asthma, uncomplicated: Secondary | ICD-10-CM

## 2018-02-04 ENCOUNTER — Other Ambulatory Visit: Payer: Self-pay | Admitting: Osteopathic Medicine

## 2018-02-04 DIAGNOSIS — J3089 Other allergic rhinitis: Secondary | ICD-10-CM
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
## 2018-05-17 ENCOUNTER — Encounter: Payer: Managed Care, Other (non HMO) | Admitting: Osteopathic Medicine

## 2018-05-31 ENCOUNTER — Encounter: Payer: Self-pay | Admitting: Osteopathic Medicine

## 2018-05-31 ENCOUNTER — Ambulatory Visit (INDEPENDENT_AMBULATORY_CARE_PROVIDER_SITE_OTHER): Payer: Managed Care, Other (non HMO) | Admitting: Osteopathic Medicine

## 2018-05-31 VITALS — BP 141/89 | HR 63 | Temp 98.0°F | Wt 222.2 lb

## 2018-05-31 DIAGNOSIS — E785 Hyperlipidemia, unspecified: Secondary | ICD-10-CM

## 2018-05-31 DIAGNOSIS — J4520 Mild intermittent asthma, uncomplicated: Secondary | ICD-10-CM

## 2018-05-31 DIAGNOSIS — Z23 Encounter for immunization: Secondary | ICD-10-CM

## 2018-05-31 DIAGNOSIS — M109 Gout, unspecified: Secondary | ICD-10-CM

## 2018-05-31 DIAGNOSIS — D126 Benign neoplasm of colon, unspecified: Secondary | ICD-10-CM | POA: Diagnosis not present

## 2018-05-31 DIAGNOSIS — Z0000 Encounter for general adult medical examination without abnormal findings: Secondary | ICD-10-CM

## 2018-05-31 DIAGNOSIS — Z125 Encounter for screening for malignant neoplasm of prostate: Secondary | ICD-10-CM

## 2018-05-31 DIAGNOSIS — J3089 Other allergic rhinitis: Secondary | ICD-10-CM

## 2018-05-31 DIAGNOSIS — B356 Tinea cruris: Secondary | ICD-10-CM

## 2018-05-31 DIAGNOSIS — Z2821 Immunization not carried out because of patient refusal: Secondary | ICD-10-CM

## 2018-05-31 DIAGNOSIS — E559 Vitamin D deficiency, unspecified: Secondary | ICD-10-CM

## 2018-05-31 MED ORDER — LORATADINE 10 MG PO TABS: 5.0000 mg | ORAL_TABLET | Freq: Every day | ORAL | 3 refills | Status: DC | PRN

## 2018-05-31 MED ORDER — ALBUTEROL SULFATE HFA 108 (90 BASE) MCG/ACT IN AERS: INHALATION_SPRAY | RESPIRATORY_TRACT | 99 refills | Status: DC

## 2018-05-31 MED ORDER — MONTELUKAST SODIUM 10 MG PO TABS: 10.0000 mg | ORAL_TABLET | Freq: Every day | ORAL | 3 refills | Status: DC

## 2018-05-31 MED ORDER — ALLOPURINOL 300 MG PO TABS: 300.0000 mg | ORAL_TABLET | Freq: Every day | ORAL | 3 refills | Status: DC

## 2018-05-31 MED ORDER — PRAVASTATIN SODIUM 40 MG PO TABS: 40.0000 mg | ORAL_TABLET | Freq: Every day | ORAL | 3 refills | Status: DC
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
## 2018-05-31 NOTE — Patient Instructions (Addendum)
General Preventive Care  Most recent routine screening lipids/other labs: ordered today. Cholesterol and Diabetes screening usually recommended annually.   Everyone should have blood pressure checked once per year.   Tobacco: don't! Alcohol: responsible moderation is ok for most adults - if you have concerns about your alcohol intake, please talk to me! Recreational/Illicit Drugs: don't!  Exercise: as tolerated to reduce risk of cardiovascular disease and diabetes. Strength training will also prevent osteoporosis.   Mental health: if need for mental health care (medicines, counseling, other), or concerns about moods, please let me know!   Sexual health: if need for STD testing, or if concerns with libido/pain problems, please let me know! Vaccines  Flu vaccine: recommended for almost everyone, every fall (by Halloween! Flu is scary!)  Shingles vaccine: you're done with Shingrix!   Pneumonia vaccines: consider Pneumovax given asthma history   Tetanus booster: Tdap recommended every 10 years, last done 2015 Cancer screenings   Colon cancer screening: Last colonoscopy 2018 with [facility], polyps found, plan for repeat as directed / 3 years.   Prostate cancer screening: recommendations vary, optional PSA blood test for men around age 61. Will get PSA with your blood work.  Infection screenings . HIV: already done, repeat if needed . Gonorrhea/Chlamydia: screening as needed, though many insurances require testing for anyone on birth control pills . Hepatitis C: already done . TB: certain at-risk populations, or depending on work requirements and/or travel history Other . Bone Density Test: recommended for men at age 61, sooner depending on risk factors . Advanced Directive: Living Will and/or Healthcare Power of Attorney recommended for all adults, regardless of age or health!             Immunization History  Administered Date(s) Administered  . Tdap  02/06/2010, 12/28/2013  . Zoster Recombinat (Shingrix) 05/18/2017, 07/19/2017

## 2018-06-01 LAB — COMPLETE METABOLIC PANEL WITH GFR
AG RATIO: 2 (calc) (ref 1.0–2.5)
ALBUMIN MSPROF: 4.3 g/dL (ref 3.6–5.1)
ALT: 18 U/L (ref 9–46)
AST: 17 U/L (ref 10–35)
Alkaline phosphatase (APISO): 53 U/L (ref 40–115)
BILIRUBIN TOTAL: 0.7 mg/dL (ref 0.2–1.2)
BUN: 16 mg/dL (ref 7–25)
CALCIUM: 9.5 mg/dL (ref 8.6–10.3)
CO2: 29 mmol/L (ref 20–32)
Chloride: 104 mmol/L (ref 98–110)
Creat: 0.95 mg/dL (ref 0.70–1.25)
GFR, EST NON AFRICAN AMERICAN: 86 mL/min/{1.73_m2} (ref >=60)
GFR, Est African American: 100 mL/min/{1.73_m2} (ref >=60)
Globulin: 2.1 g/dL (calc) (ref 1.9–3.7)
Glucose, Bld: 95 mg/dL (ref 65–99)
POTASSIUM: 4.5 mmol/L (ref 3.5–5.3)
SODIUM: 140 mmol/L (ref 135–146)
TOTAL PROTEIN: 6.4 g/dL (ref 6.1–8.1)

## 2018-06-01 LAB — CBC
HEMATOCRIT: 45.3 % (ref 38.5–50.0)
HEMOGLOBIN: 15.7 g/dL (ref 13.2–17.1)
MCH: 33 pg (ref 27.0–33.0)
MCHC: 34.7 g/dL (ref 32.0–36.0)
MCV: 95.2 fL (ref 80.0–100.0)
MPV: 9.8 fL (ref 7.5–12.5)
Platelets: 243 10*3/uL (ref 140–400)
RBC: 4.76 10*6/uL (ref 4.20–5.80)
RDW: 12.6 % (ref 11.0–15.0)
WBC: 4.6 10*3/uL (ref 3.8–10.8)

## 2018-06-01 LAB — PSA, TOTAL WITH REFLEX TO PSA, FREE: PSA, TOTAL: 2 ng/mL (ref <=4.0)

## 2018-06-01 LAB — LIPID PANEL
Cholesterol: 200 mg/dL — ABNORMAL HIGH (ref <200)
HDL: 38 mg/dL — AB (ref >40)
LDL Cholesterol (Calc): 137 mg/dL (calc) — ABNORMAL HIGH
NON-HDL CHOLESTEROL (CALC): 162 mg/dL — AB (ref <130)
Total CHOL/HDL Ratio: 5.3 (calc) — ABNORMAL HIGH (ref <5.0)
Triglycerides: 123 mg/dL (ref <150)

## 2018-06-01 LAB — TSH: TSH: 1.62 mIU/L (ref 0.40–4.50)

## 2018-06-01 LAB — URIC ACID: URIC ACID, SERUM: 4.7 mg/dL (ref 4.0–8.0)

## 2018-12-22 ENCOUNTER — Other Ambulatory Visit: Payer: Self-pay | Admitting: Osteopathic Medicine

## 2018-12-22 DIAGNOSIS — M545 Low back pain, unspecified: Secondary | ICD-10-CM

## 2018-12-22 DIAGNOSIS — M25551 Pain in right hip: Secondary | ICD-10-CM

## 2019-02-12 ENCOUNTER — Other Ambulatory Visit: Payer: Self-pay | Admitting: Osteopathic Medicine

## 2019-02-12 DIAGNOSIS — M545 Low back pain, unspecified: Secondary | ICD-10-CM

## 2019-02-12 DIAGNOSIS — M25551 Pain in right hip: Secondary | ICD-10-CM

## 2019-03-09 ENCOUNTER — Other Ambulatory Visit: Payer: Self-pay | Admitting: Osteopathic Medicine

## 2019-03-09 DIAGNOSIS — M25551 Pain in right hip: Secondary | ICD-10-CM

## 2019-03-09 DIAGNOSIS — M545 Low back pain, unspecified: Secondary | ICD-10-CM

## 2019-03-09 NOTE — Telephone Encounter (Signed)
Forwarding medication refill to PCP for review. 

## 2019-06-18 ENCOUNTER — Other Ambulatory Visit: Payer: Self-pay | Admitting: Osteopathic Medicine

## 2019-06-18 DIAGNOSIS — J4520 Mild intermittent asthma, uncomplicated: Secondary | ICD-10-CM

## 2019-06-18 DIAGNOSIS — J3089 Other allergic rhinitis: Secondary | ICD-10-CM

## 2019-06-19 NOTE — Telephone Encounter (Signed)
Requested medication (s) are due for refill today: yes  Requested medication (s) are on the active medication list: yes  Last refill:  03/22/2019  Future visit scheduled: no  Notes to clinic:  Overdue for office visit  Review for refill   Requested Prescriptions  Pending Prescriptions Disp Refills   montelukast (SINGULAIR) 10 MG tablet [Pharmacy Med Name: MONTELUKAST SOD 10 MG TABLET] 90 tablet 3    Sig: TAKE 1 TABLET BY MOUTH EVERYDAY AT BEDTIME     Pulmonology:  Leukotriene Inhibitors Failed - 06/18/2019  9:47 AM      Failed - Valid encounter within last 12 months    Recent Outpatient Visits          1 year ago Annual physical exam   [facility], Lanelle Bal, DO   1 year ago Cough   [place], Lanelle Bal, DO   1 year ago Right hip pain    [facility], Lanelle Bal, DO   1 year ago Need for shingles vaccine   [facility], Lanelle Bal, DO   2 years ago Annual physical exam   [facility], [place], DO

## 2019-08-02 ENCOUNTER — Other Ambulatory Visit: Payer: Self-pay | Admitting: Osteopathic Medicine

## 2019-08-02 DIAGNOSIS — M109 Gout, unspecified: Secondary | ICD-10-CM

## 2019-08-28 ENCOUNTER — Encounter: Payer: Self-pay | Admitting: Medical-Surgical

## 2019-08-28 ENCOUNTER — Ambulatory Visit (INDEPENDENT_AMBULATORY_CARE_PROVIDER_SITE_OTHER): Payer: Managed Care, Other (non HMO) | Admitting: Medical-Surgical

## 2019-08-28 ENCOUNTER — Other Ambulatory Visit: Payer: Self-pay

## 2019-08-28 VITALS — BP 138/69 | HR 60 | Temp 98.0°F | Ht 71.0 in | Wt 225.0 lb

## 2019-08-28 DIAGNOSIS — L989 Disorder of the skin and subcutaneous tissue, unspecified: Secondary | ICD-10-CM | POA: Diagnosis not present

## 2019-08-28 DIAGNOSIS — R1033 Periumbilical pain: Secondary | ICD-10-CM

## 2019-08-28 MED ORDER — CEPHALEXIN 500 MG PO CAPS: 500.0000 mg | ORAL_CAPSULE | Freq: Two times a day (BID) | ORAL | 0 refills | Status: DC

## 2019-08-28 MED ORDER — FLUCONAZOLE 150 MG PO TABS: 150.0000 mg | ORAL_TABLET | Freq: Once | ORAL | 1 refills | Status: AC
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
## 2019-08-29 ENCOUNTER — Encounter: Payer: Self-pay | Admitting: Osteopathic Medicine

## 2019-08-31 ENCOUNTER — Encounter: Payer: Self-pay | Admitting: Osteopathic Medicine

## 2019-09-01 ENCOUNTER — Telehealth: Payer: Self-pay

## 2019-09-01 NOTE — Telephone Encounter (Signed)
Patient advised of results and recommendations. He states he is feeling a little better. Advised to call back if symptoms persist or worsen.

## 2019-09-01 NOTE — Telephone Encounter (Signed)
Good news!  The CT did not show any hernia in the umbilical area.  This seems to point toward a possible skin or soft tissue infection in that area causing the pain you are having.  Has the Keflex and Diflucan seem to help with the discomfort so far?  Thanks, Samuel Bouche, FNP

## 2019-09-01 NOTE — Telephone Encounter (Signed)
Tharen called about CT results.   CT ABDOMEN PELVIS W CONTRAST (ROUTINE)08/30/2019 [facility] Result Impression   1. No acute CT abnormality within the abdomen or pelvis. 2. Ancillary findings as above.  Result Narrative  CT ABDOMEN PELVIS W CONTRAST (ROUTINE), 08/30/2019 9:38 AM  INDICATION: \ [id number] Periumbilical abdominal pain  COMPARISON: None.  TECHNIQUE: Multislice axial images were obtained through the abdomen and pelvis with administration of iodinated intravenous contrast material. Multi-planar reformatted images were generated for additional analysis. Nongated technique limits cardiac detail.  All CT scans at [facility] and [place] are performed using dose optimization techniques as appropriate to a performed exam, including but not limited to one or more of the following: automated exposure control, adjustment of the mA and/or kV according to patient size, use of iterative reconstruction technique. In addition, [facility] is participating in the [place] program which will further assist [place] in optimizing patient radiation exposure.   FINDINGS:   LOWER CHEST: . Mediastinum: Within normal limits.  . Heart/vessel: Normal heart size. No pericardial effusion.  . Lungs: Within normal limits. . Pleura: Within normal limits.   ABDOMEN/PELVIS: . Liver: Within normal limits. . Gallbladder/biliary: Within normal limits. . Spleen: Small accessory splenule. . Pancreas: Within normal limits. . Adrenals: Within normal limits. . Kidneys: Symmetric enhancement bilaterally without hydronephrosis. Nonobstructing bilateral renal calculi, largest within the left upper pole measuring 4 mm. . Peritoneum/mesentery/extraperitoneum: Within normal limits. . GI tract: No evidence of obstruction. Scattered colonic diverticula without associated inflammatory changes. . Ureters: Within normal limits. . Bladder:  Within normal limits. . Reproductive system: Prostatomegaly with dystrophic prostate calcifications. . Vascular: Scattered atherosclerotic disease. No abdominal aortic aneurysm.  MSK: . Fat-containing left inguinal hernia.  Other Result Information  Interface, Rad Results In - 08/30/2019 11:53 AM EST CT ABDOMEN PELVIS W CONTRAST (ROUTINE), 08/30/2019 9:38 AM  INDICATION: \ [id number] Periumbilical abdominal pain  COMPARISON: None.  TECHNIQUE: Multislice axial images were obtained through the abdomen and pelvis with administration of iodinated intravenous contrast material. Multi-planar reformatted images were generated for additional analysis. Nongated technique limits cardiac detail.  All CT scans at [facility] and [place] are performed using dose optimization techniques as appropriate to a performed exam, including but not limited to one or more of the following: automated exposure control, adjustment of the mA and/or kV according to patient size, use of iterative reconstruction technique. In addition, [facility] is participating in the [place] program which will further assist [place] in optimizing patient radiation exposure.   FINDINGS:   LOWER CHEST: .  Mediastinum: Within normal limits.  .  Heart/vessel: Normal heart size. No pericardial effusion.  .  Lungs: Within normal limits. .  Pleura: Within normal limits.   ABDOMEN/PELVIS: .  Liver: Within normal limits. .  Gallbladder/biliary: Within normal limits. .  Spleen: Small accessory splenule. .  Pancreas: Within normal limits. .  Adrenals: Within normal limits. .  Kidneys: Symmetric enhancement bilaterally without hydronephrosis. Nonobstructing bilateral renal calculi, largest within the left upper pole measuring 4 mm. .  Peritoneum/mesentery/extraperitoneum: Within normal limits. .  GI tract: No evidence of obstruction. Scattered colonic diverticula without associated inflammatory  changes. .  Ureters: Within normal limits. .  Bladder: Within normal limits. .  Reproductive system: Prostatomegaly with dystrophic prostate calcifications. .  Vascular: Scattered atherosclerotic disease. No abdominal aortic aneurysm.  MSK: .  Fat-containing left inguinal hernia.  CONCLUSION:  1.  No acute CT abnormality within the abdomen or pelvis. 2.  Ancillary findings as above.

## 2019-09-04 ENCOUNTER — Encounter: Payer: Self-pay | Admitting: Osteopathic Medicine

## 2019-09-04 DIAGNOSIS — L989 Disorder of the skin and subcutaneous tissue, unspecified: Secondary | ICD-10-CM

## 2019-09-05 ENCOUNTER — Encounter: Payer: Self-pay | Admitting: Osteopathic Medicine

## 2019-09-14 ENCOUNTER — Encounter: Payer: Self-pay | Admitting: Osteopathic Medicine

## 2019-10-28 ENCOUNTER — Other Ambulatory Visit: Payer: Self-pay | Admitting: Osteopathic Medicine

## 2019-10-28 DIAGNOSIS — M109 Gout, unspecified: Secondary | ICD-10-CM

## 2019-11-21 ENCOUNTER — Other Ambulatory Visit: Payer: Self-pay | Admitting: Osteopathic Medicine

## 2019-11-21 DIAGNOSIS — M109 Gout, unspecified: Secondary | ICD-10-CM

## 2019-11-29 ENCOUNTER — Other Ambulatory Visit: Payer: Self-pay | Admitting: Osteopathic Medicine

## 2019-11-29 DIAGNOSIS — M109 Gout, unspecified: Secondary | ICD-10-CM

## 2020-01-04 ENCOUNTER — Ambulatory Visit: Payer: Managed Care, Other (non HMO) | Admitting: Osteopathic Medicine

## 2020-01-04 ENCOUNTER — Telehealth: Payer: Self-pay | Admitting: Osteopathic Medicine

## 2020-01-04 NOTE — Telephone Encounter (Signed)
Pt called at 3:50 to cancel appointment due to uncontrollable circumstances. He has rescheduled his appointment and wants to know if he can still get a refill on his Allopurinol.  Thanks.

## 2020-01-05 ENCOUNTER — Other Ambulatory Visit: Payer: Self-pay | Admitting: Family Medicine

## 2020-01-05 ENCOUNTER — Encounter: Payer: Self-pay | Admitting: Osteopathic Medicine

## 2020-01-05 ENCOUNTER — Other Ambulatory Visit: Payer: Self-pay

## 2020-01-05 DIAGNOSIS — M109 Gout, unspecified: Secondary | ICD-10-CM

## 2020-01-05 MED ORDER — ALLOPURINOL 300 MG PO TABS: 300.0000 mg | ORAL_TABLET | Freq: Every day | ORAL | 0 refills | Status: DC

## 2020-01-05 NOTE — Telephone Encounter (Signed)
Sent Rx for 7 pills to pharmacy due to rescheduling.  No additional refills.

## 2020-01-16 ENCOUNTER — Encounter: Payer: Self-pay | Admitting: Osteopathic Medicine

## 2020-01-16 ENCOUNTER — Ambulatory Visit (INDEPENDENT_AMBULATORY_CARE_PROVIDER_SITE_OTHER): Payer: Managed Care, Other (non HMO) | Admitting: Osteopathic Medicine

## 2020-01-16 VITALS — BP 118/82 | HR 70 | Temp 98.0°F | Wt 223.0 lb

## 2020-01-16 DIAGNOSIS — J3089 Other allergic rhinitis: Secondary | ICD-10-CM | POA: Diagnosis not present

## 2020-01-16 DIAGNOSIS — Z0000 Encounter for general adult medical examination without abnormal findings: Secondary | ICD-10-CM

## 2020-01-16 DIAGNOSIS — Z125 Encounter for screening for malignant neoplasm of prostate: Secondary | ICD-10-CM

## 2020-01-16 DIAGNOSIS — J4520 Mild intermittent asthma, uncomplicated: Secondary | ICD-10-CM | POA: Diagnosis not present

## 2020-01-16 DIAGNOSIS — E785 Hyperlipidemia, unspecified: Secondary | ICD-10-CM

## 2020-01-16 DIAGNOSIS — M109 Gout, unspecified: Secondary | ICD-10-CM | POA: Diagnosis not present

## 2020-01-16 DIAGNOSIS — E559 Vitamin D deficiency, unspecified: Secondary | ICD-10-CM

## 2020-01-16 MED ORDER — PRAVASTATIN SODIUM 40 MG PO TABS: 40.0000 mg | ORAL_TABLET | Freq: Every day | ORAL | 3 refills | Status: DC

## 2020-01-16 MED ORDER — ALLOPURINOL 300 MG PO TABS: 300.0000 mg | ORAL_TABLET | Freq: Every day | ORAL | 3 refills | Status: DC

## 2020-01-16 MED ORDER — MONTELUKAST SODIUM 10 MG PO TABS: ORAL_TABLET | ORAL | 3 refills | Status: DC

## 2020-01-16 NOTE — Progress Notes (Signed)
David Melendez is a 64 y.o. male who presents to  [place] at [facility]  today, 01/16/20, seeking care for the following: . ANNUAL      ASSESSMENT & PLAN with other pertinent history/findings:  The primary encounter diagnosis was Annual physical exam. Diagnoses of Mild intermittent asthma without complication, Seasonal allergic rhinitis due to other allergic trigger, Gout, unspecified cause, unspecified chronicity, unspecified site, Hyperlipidemia, unspecified hyperlipidemia type, Prostate cancer screening, and Vitamin D deficiency were also pertinent to this visit.      Patient Instructions  General Preventive Care  Most recent routine screening labs: ordered today.   Everyone should have blood pressure checked once per year. Goal 130/80 or less.   Tobacco: don't!  Alcohol: responsible moderation is ok for most adults - if you have concerns about your alcohol intake, please talk to me!   Exercise: as tolerated to reduce risk of cardiovascular disease and diabetes. Strength training will also prevent osteoporosis.   Mental health: if need for mental health care (medicines, counseling, other), or concerns about moods, please let me know!   Sexual health: if need for STD testing, or if concerns with libido/pain problems, please let me know!   Advanced Directive: Living Will and/or Healthcare Power of Attorney recommended for all adults, regardless of age or health.  Vaccines  Flu vaccine: for almost everyone, every fall.   Shingles vaccine: after age 82.   Pneumonia vaccines: after age 20, or sooner if certain medical conditions.  Tetanus booster: every 10 years - due 2025  COVID vaccine:  Cancer screenings   Colon cancer screening: follow-up per GI - colonoscopy due 06/2020  Prostate cancer screening: PSA blood test age 64-71, ordered w/ today's labs   Lung cancer screening: not needed since quit >15 years  ago. Infection screenings  . HIV: recommended screening at least once age 23-65, DONE - repeat as needed. . Gonorrhea/Chlamydia: screening as needed . Hepatitis C: recommended once for everyone age 88-75, DONE - repeat as needed  . TB: certain at-risk populations, or depending on work requirements and/or travel history Other . Bone Density Test: recommended at age 66, sooner depending on risk factors . Abdominal Aortic Aneurysm: screening with ultrasound recommended once for men age 65-75 who have ever smoked     Orders Placed This Encounter  Procedures  . CBC  . COMPLETE METABOLIC PANEL WITH GFR  . Lipid panel  . PSA, Total with Reflex to PSA, Free  . VITAMIN D 25 Hydroxy (Vit-D Deficiency, Fractures)  . Uric acid    Meds ordered this encounter  Medications  . pravastatin (PRAVACHOL) 40 MG tablet    Sig: Take 1 tablet (40 mg total) by mouth daily.    Dispense:  90 tablet    Refill:  3  . montelukast (SINGULAIR) 10 MG tablet    Sig: TAKE 1 TABLET BY MOUTH EVERYDAY AT BEDTIME    Dispense:  90 tablet    Refill:  3  . allopurinol (ZYLOPRIM) 300 MG tablet    Sig: Take 1 tablet (300 mg total) by mouth daily.    Dispense:  90 tablet    Refill:  3    Constitutional:  . VSS, see nurse notes . General Appearance: alert, well-developed, well-nourished, NAD Eyes: Marland Kitchen Normal lids and conjunctive, non-icteric sclera Ears, Nose, Mouth, Throat: . Normal external auditory canal and TM bilaterally Neck: . No masses, trachea midline . No thyroid enlargement/tenderness/mass appreciated Respiratory: . Normal respiratory  effort . Breath sounds normal, no wheeze/rhonchi/rales Cardiovascular: . S1/S2 normal, no murmur/rub/gallop auscultated . No lower extremity edema Gastrointestinal: . Nontender, no masses . No hepatomegaly, no splenomegaly . No hernia appreciated Musculoskeletal:  . Gait normal . No clubbing/cyanosis of digits Neurological: . No cranial nerve deficit on limited  exam . Motor and sensation intact and symmetric Psychiatric: . Normal judgment/insight . Normal mood and affect    Follow-up instructions: Return in about 1 year (around 01/15/2021) for [place] (call week prior to visit for lab orders).                                         BP 118/82   Pulse 70   Temp 98 F (36.7 C)   Wt 223 lb (101.2 kg)   BMI 31.10 kg/m   Current Meds  Medication Sig  . albuterol (PROAIR HFA) 108 (90 Base) MCG/ACT inhaler TAKE 1 TO 2 PUFFS EVERY 4 HOURS AS NEEDED  . allopurinol (ZYLOPRIM) 300 MG tablet Take 1 tablet (300 mg total) by mouth daily.  . montelukast (SINGULAIR) 10 MG tablet TAKE 1 TABLET BY MOUTH EVERYDAY AT BEDTIME  . Probiotic Product (PROBIOTIC PO) Take by mouth daily as needed.  . triamcinolone cream (KENALOG) 0.1 % APPLY TWICE A DAY TO ABDOMEN FOR 2 3 WEEKS AS NEEDED FOR ITCHING  . [DISCONTINUED] allopurinol (ZYLOPRIM) 300 MG tablet TAKE 1 TABLET BY MOUTH DAILY. PATIENT NEEDS TO MAKE APPOINTMENT.  . [DISCONTINUED] fluconazole (DIFLUCAN) 150 MG tablet TAKE 1 TABLET BY MOUTH MAY REPEAT IN 3 DAYS  . [DISCONTINUED] montelukast (SINGULAIR) 10 MG tablet TAKE 1 TABLET BY MOUTH EVERYDAY AT BEDTIME    No results found for this or any previous visit (from the past 72 hour(s)).  No results found.  Depression screen [facility] 2/9 05/31/2018 05/18/2017 01/05/2017  Decreased Interest 0 0 0  Down, Depressed, Hopeless 0 0 0  PHQ - 2 Score 0 0 0  Altered sleeping 0 0 0  Tired, decreased energy 0 0 0  Change in appetite 0 0 0  Feeling bad or failure about yourself  0 0 0  Trouble concentrating 0 0 0  Moving slowly or fidgety/restless 0 0 0  Suicidal thoughts 0 0 0  PHQ-9 Score 0 0 0    GAD 7 : Generalized Anxiety Score 05/31/2018  Nervous, Anxious, on Edge 0  Control/stop worrying 0  Worry too much - different things 0  Trouble relaxing 0  Restless 0  Easily annoyed or irritable 0  Afraid - awful might happen 0   Total GAD 7 Score 0      All questions at time of visit were answered - patient instructed to contact office with any additional concerns or updates.  ER/RTC precautions were reviewed with the patient.  Please note: voice recognition software was used to produce this document, and typos may escape review. Please contact Dr. Sheppard Coil for any needed clarifications.

## 2020-01-16 NOTE — Patient Instructions (Signed)
General Preventive Care  Most recent routine screening labs: ordered today.   Everyone should have blood pressure checked once per year. Goal 130/80 or less.   Tobacco: don't!  Alcohol: responsible moderation is ok for most adults - if you have concerns about your alcohol intake, please talk to me!   Exercise: as tolerated to reduce risk of cardiovascular disease and diabetes. Strength training will also prevent osteoporosis.   Mental health: if need for mental health care (medicines, counseling, other), or concerns about moods, please let me know!   Sexual health: if need for STD testing, or if concerns with libido/pain problems, please let me know!   Advanced Directive: Living Will and/or Healthcare Power of Attorney recommended for all adults, regardless of age or health.  Vaccines  Flu vaccine: for almost everyone, every fall.   Shingles vaccine: after age 43.   Pneumonia vaccines: after age 75, or sooner if certain medical conditions.  Tetanus booster: every 10 years - due 2025  COVID vaccine:  Cancer screenings   Colon cancer screening: follow-up per GI - colonoscopy due 06/2020  Prostate cancer screening: PSA blood test age 69-71, ordered w/ today's labs   Lung cancer screening: not needed since quit >15 years ago. Infection screenings  . HIV: recommended screening at least once age 33-65, DONE - repeat as needed. . Gonorrhea/Chlamydia: screening as needed . Hepatitis C: recommended once for everyone age 27-75, DONE - repeat as needed  . TB: certain at-risk populations, or depending on work requirements and/or travel history Other . Bone Density Test: recommended at age 57, sooner depending on risk factors . Abdominal Aortic Aneurysm: screening with ultrasound recommended once for men age 37-75 who have ever smoked

## 2020-01-17 LAB — COMPLETE METABOLIC PANEL WITH GFR
AG Ratio: 1.8 (calc) (ref 1.0–2.5)
ALT: 18 U/L (ref 9–46)
AST: 17 U/L (ref 10–35)
Albumin: 4.2 g/dL (ref 3.6–5.1)
Alkaline phosphatase (APISO): 52 U/L (ref 35–144)
BUN: 13 mg/dL (ref 7–25)
CO2: 32 mmol/L (ref 20–32)
Calcium: 9.7 mg/dL (ref 8.6–10.3)
Chloride: 103 mmol/L (ref 98–110)
Creat: 0.96 mg/dL (ref 0.70–1.25)
GFR, Est African American: 98 mL/min/{1.73_m2} (ref >=60)
GFR, Est Non African American: 84 mL/min/{1.73_m2} (ref >=60)
Globulin: 2.4 g/dL (calc) (ref 1.9–3.7)
Glucose, Bld: 89 mg/dL (ref 65–99)
Potassium: 4.5 mmol/L (ref 3.5–5.3)
Sodium: 140 mmol/L (ref 135–146)
Total Bilirubin: 0.7 mg/dL (ref 0.2–1.2)
Total Protein: 6.6 g/dL (ref 6.1–8.1)

## 2020-01-17 LAB — CBC
HCT: 47.8 % (ref 38.5–50.0)
Hemoglobin: 16.6 g/dL (ref 13.2–17.1)
MCH: 33.3 pg — ABNORMAL HIGH (ref 27.0–33.0)
MCHC: 34.7 g/dL (ref 32.0–36.0)
MCV: 95.8 fL (ref 80.0–100.0)
MPV: 9.5 fL (ref 7.5–12.5)
Platelets: 218 10*3/uL (ref 140–400)
RBC: 4.99 10*6/uL (ref 4.20–5.80)
RDW: 12.6 % (ref 11.0–15.0)
WBC: 4.8 10*3/uL (ref 3.8–10.8)

## 2020-01-17 LAB — URIC ACID: Uric Acid, Serum: 4.7 mg/dL (ref 4.0–8.0)

## 2020-01-17 LAB — LIPID PANEL
Cholesterol: 228 mg/dL — ABNORMAL HIGH (ref <200)
HDL: 37 mg/dL — ABNORMAL LOW (ref >=40)
LDL Cholesterol (Calc): 159 mg/dL (calc) — ABNORMAL HIGH
Non-HDL Cholesterol (Calc): 191 mg/dL (calc) — ABNORMAL HIGH (ref <130)
Total CHOL/HDL Ratio: 6.2 (calc) — ABNORMAL HIGH (ref <5.0)
Triglycerides: 175 mg/dL — ABNORMAL HIGH (ref <150)

## 2020-01-17 LAB — PSA, TOTAL WITH REFLEX TO PSA, FREE: PSA, Total: 1.9 ng/mL (ref <=4.0)

## 2020-01-30 ENCOUNTER — Encounter (INDEPENDENT_AMBULATORY_CARE_PROVIDER_SITE_OTHER): Payer: Managed Care, Other (non HMO) | Admitting: Osteopathic Medicine

## 2020-01-30 DIAGNOSIS — M109 Gout, unspecified: Secondary | ICD-10-CM | POA: Diagnosis not present

## 2020-01-30 MED ORDER — COLCHICINE 0.6 MG PO CAPS: 0.6000 mg | ORAL_CAPSULE | ORAL | 1 refills | Status: DC

## 2020-01-30 NOTE — Telephone Encounter (Signed)
5 mins spent  

## 2020-08-07 LAB — HM COLONOSCOPY

## 2020-09-02 ENCOUNTER — Encounter: Payer: Self-pay | Admitting: Osteopathic Medicine

## 2021-01-08 ENCOUNTER — Telehealth (INDEPENDENT_AMBULATORY_CARE_PROVIDER_SITE_OTHER): Payer: Managed Care, Other (non HMO) | Admitting: Osteopathic Medicine

## 2021-01-08 ENCOUNTER — Encounter: Payer: Self-pay | Admitting: Osteopathic Medicine

## 2021-01-08 VITALS — BP 159/88 | Wt 227.0 lb

## 2021-01-08 DIAGNOSIS — H04129 Dry eye syndrome of unspecified lacrimal gland: Secondary | ICD-10-CM

## 2021-01-08 DIAGNOSIS — H938X3 Other specified disorders of ear, bilateral: Secondary | ICD-10-CM | POA: Diagnosis not present

## 2021-01-08 DIAGNOSIS — I10 Essential (primary) hypertension: Secondary | ICD-10-CM

## 2021-01-08 MED ORDER — ARTIFICIAL TEARS OPHTHALMIC OINT: TOPICAL_OINTMENT | OPHTHALMIC | 1 refills | Status: DC | PRN
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
## 2021-01-09 LAB — COMPLETE METABOLIC PANEL WITH GFR
AG Ratio: 1.8 (calc) (ref 1.0–2.5)
ALT: 24 U/L (ref 9–46)
AST: 19 U/L (ref 10–35)
Albumin: 4.4 g/dL (ref 3.6–5.1)
Alkaline phosphatase (APISO): 53 U/L (ref 35–144)
BUN: 11 mg/dL (ref 7–25)
CO2: 31 mmol/L (ref 20–32)
Calcium: 9.8 mg/dL (ref 8.6–10.3)
Chloride: 101 mmol/L (ref 98–110)
Creat: 0.93 mg/dL (ref 0.70–1.25)
GFR, Est African American: 101 mL/min/{1.73_m2} (ref >=60)
GFR, Est Non African American: 87 mL/min/{1.73_m2} (ref >=60)
Globulin: 2.5 g/dL (calc) (ref 1.9–3.7)
Glucose, Bld: 94 mg/dL (ref 65–139)
Potassium: 4 mmol/L (ref 3.5–5.3)
Sodium: 139 mmol/L (ref 135–146)
Total Bilirubin: 0.7 mg/dL (ref 0.2–1.2)
Total Protein: 6.9 g/dL (ref 6.1–8.1)

## 2021-01-09 LAB — URINALYSIS, ROUTINE W REFLEX MICROSCOPIC
Bilirubin Urine: NEGATIVE
Glucose, UA: NEGATIVE
Hgb urine dipstick: NEGATIVE
Ketones, ur: NEGATIVE
Leukocytes,Ua: NEGATIVE
Nitrite: NEGATIVE
Protein, ur: NEGATIVE
Specific Gravity, Urine: 1.015 (ref 1.001–1.035)
pH: 7.5 (ref 5.0–8.0)

## 2021-01-09 LAB — CBC
HCT: 49.4 % (ref 38.5–50.0)
Hemoglobin: 16.9 g/dL (ref 13.2–17.1)
MCH: 32.8 pg (ref 27.0–33.0)
MCHC: 34.2 g/dL (ref 32.0–36.0)
MCV: 95.7 fL (ref 80.0–100.0)
MPV: 9.3 fL (ref 7.5–12.5)
Platelets: 237 10*3/uL (ref 140–400)
RBC: 5.16 10*6/uL (ref 4.20–5.80)
RDW: 12.7 % (ref 11.0–15.0)
WBC: 5.6 10*3/uL (ref 3.8–10.8)

## 2021-01-09 LAB — LIPID PANEL
Cholesterol: 253 mg/dL — ABNORMAL HIGH (ref <200)
HDL: 35 mg/dL — ABNORMAL LOW (ref >=40)
LDL Cholesterol (Calc): 174 mg/dL (calc) — ABNORMAL HIGH
Non-HDL Cholesterol (Calc): 218 mg/dL (calc) — ABNORMAL HIGH (ref <130)
Total CHOL/HDL Ratio: 7.2 (calc) — ABNORMAL HIGH (ref <5.0)
Triglycerides: 270 mg/dL — ABNORMAL HIGH (ref <150)

## 2021-01-09 LAB — PSA, TOTAL WITH REFLEX TO PSA, FREE: PSA, Total: 2.3 ng/mL (ref <=4.0)

## 2021-01-09 LAB — TSH: TSH: 2.02 mIU/L (ref 0.40–4.50)

## 2021-01-13 ENCOUNTER — Other Ambulatory Visit: Payer: Self-pay | Admitting: Osteopathic Medicine

## 2021-01-14 ENCOUNTER — Other Ambulatory Visit: Payer: Self-pay | Admitting: Osteopathic Medicine

## 2021-01-15 ENCOUNTER — Other Ambulatory Visit: Payer: Self-pay

## 2021-01-15 ENCOUNTER — Encounter: Payer: Self-pay | Admitting: Osteopathic Medicine

## 2021-01-15 ENCOUNTER — Ambulatory Visit (INDEPENDENT_AMBULATORY_CARE_PROVIDER_SITE_OTHER): Payer: Managed Care, Other (non HMO) | Admitting: Osteopathic Medicine

## 2021-01-15 VITALS — BP 129/88 | HR 76 | Temp 98.4°F | Wt 229.1 lb

## 2021-01-15 DIAGNOSIS — Z0000 Encounter for general adult medical examination without abnormal findings: Secondary | ICD-10-CM | POA: Diagnosis not present

## 2021-01-15 DIAGNOSIS — E782 Mixed hyperlipidemia: Secondary | ICD-10-CM | POA: Diagnosis not present

## 2021-01-15 MED ORDER — PRAVASTATIN SODIUM 80 MG PO TABS: 80.0000 mg | ORAL_TABLET | Freq: Every day | ORAL | 3 refills | Status: DC

## 2021-01-15 MED ORDER — PRAVASTATIN SODIUM 40 MG PO TABS: 40.0000 mg | ORAL_TABLET | Freq: Every day | ORAL | 3 refills | Status: DC

## 2021-01-15 MED ORDER — ALLOPURINOL 300 MG PO TABS: 300.0000 mg | ORAL_TABLET | Freq: Every day | ORAL | 3 refills | Status: DC

## 2021-01-15 MED ORDER — MONTELUKAST SODIUM 10 MG PO TABS: 10.0000 mg | ORAL_TABLET | Freq: Every day | ORAL | 3 refills | Status: DC

## 2021-01-15 MED ORDER — COLCHICINE 0.6 MG PO CAPS: 0.6000 mg | ORAL_CAPSULE | ORAL | 1 refills | Status: DC
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
## 2021-01-15 NOTE — Patient Instructions (Addendum)
General Preventive Care  Most recent routine screening labs: ordered.   Blood pressure goal 130/80 or less.   Tobacco: don't!   Alcohol: responsible moderation is ok for most adults - if you have concerns about your alcohol intake, please talk to me!   Exercise: as tolerated to reduce risk of cardiovascular disease and diabetes. Strength training will also prevent osteoporosis.   Mental health: if need for mental health care (medicines, counseling, other), or concerns about moods, please let me know!   Sexual / Reproductive health: if need for STD testing, or if concerns with libido/pain problems, please let me know  Advanced Directive: Living Will and/or Day recommended for all adults, regardless of age or health.  Vaccines  Flu vaccine: for almost everyone, every fall.   Shingles vaccine: all done!   Pneumonia vaccines: after age 64.  Tetanus booster: every 10 years, due 2025  COVID vaccine: THANKS for getting your vaccine! :) Cancer screenings   Colon cancer screening: Colonoscopy due 07/2023  Prostate cancer screening: PSA blood test age 50-71  Lung cancer screening: not needed since quit >15 years ago  Infection screenings  . HIV and Hep C: recommended screening at least once age 37-65 done, repeat as needed. Rondell Reams, other STI: screening as needed.  . TB: certain at-risk populations Other . Bone Density Test: recommended for men at age 79 . Abdominal Aortic Aneurysm: screening with ultrasound recommended once for men age 51-75 who have ever smoked 100+ cigarettes.

## 2022-01-08 ENCOUNTER — Other Ambulatory Visit: Payer: Self-pay | Admitting: Osteopathic Medicine

## 2022-01-18 NOTE — Progress Notes (Unsigned)
Complete physical exam  Patient: David Melendez   DOB: Jun 13, 1956   66 y.o. Male  MRN: [id number]  Subjective:    No chief complaint on file.   David Melendez is a 66 y.o. male who presents today for a complete physical exam. He reports consuming a {diet types:17450} diet. {types:19826} He generally feels {DESC; WELL/FAIRLY WELL/POORLY:18703}. He reports sleeping {DESC; WELL/FAIRLY WELL/POORLY:18703}. He {does/does not:200015} have additional problems to discuss today.    Most recent fall risk assessment:    01/15/2021    9:13 AM  [place] in the past year? 0  Number falls in past yr: 0  Injury with Fall? 0  Risk for fall due to : No Fall Risks  Follow up Falls evaluation completed     Most recent depression screenings:    01/15/2021    9:13 AM 05/31/2018    8:22 AM  PHQ 2/9 Scores  PHQ - 2 Score 0 0  PHQ- 9 Score  0    {VISON DENTAL STD PSA (Optional):27386}  {History (Optional):23778}  No care team member to display   Outpatient Medications Prior to Visit  Medication Sig   albuterol (PROAIR HFA) 108 (90 Base) MCG/ACT inhaler TAKE 1 TO 2 PUFFS EVERY 4 HOURS AS NEEDED   allopurinol (ZYLOPRIM) 300 MG tablet Take 1 tablet (300 mg total) by mouth daily.   artificial tears (LACRILUBE) OINT ophthalmic ointment Place into both eyes every 3 (three) hours as needed for dry eyes.   Colchicine 0.6 MG CAPS Take 0.6 mg by mouth as directed. Day 1 take 1 tablet po tid, Day 2 onward until flare resolves, take 1 tablet bid - prn gout flare   montelukast (SINGULAIR) 10 MG tablet Take 1 tablet (10 mg total) by mouth daily.   pravastatin (PRAVACHOL) 80 MG tablet Take 1 tablet (80 mg total) by mouth daily. NO REFILLS. NEEDS TO TRANSFER CARE TO NEW PCP   triamcinolone cream (KENALOG) 0.1 % APPLY TWICE A DAY TO ABDOMEN FOR 2 3 WEEKS AS NEEDED FOR ITCHING   No facility-administered medications prior to visit.    ROS        Objective:     There were no vitals taken for  this visit. {Vitals History (Optional):23777}  Physical Exam   No results found for any visits on 01/19/22. {Show previous labs (optional):23779}    Assessment & Plan:    Routine Health Maintenance and Physical Exam  Immunization History  Administered Date(s) Administered   Influenza,inj,Quad PF,6+ Mos 05/31/2018   Moderna Sars-Covid-2 Vaccination 04/26/2020, 05/24/2020   Tdap 02/06/2010, 12/28/2013   Zoster Recombinat (Shingrix) 05/18/2017, 07/19/2017    Health Maintenance  Topic Date Due   COVID-19 Vaccine (3 - Booster for Moderna series) 07/19/2020   INFLUENZA VACCINE  03/17/2022   COLONOSCOPY (Pts 45-2yr Insurance coverage will need to be confirmed)  08/08/2023   TETANUS/TDAP  12/29/2023   Hepatitis C Screening  Completed   HIV Screening  Completed   Zoster Vaccines- Shingrix  Completed   HPV VACCINES  Aged Out    Discussed health benefits of physical activity, and encouraged him to engage in regular exercise appropriate for his age and condition.  Problem List Items Addressed This Visit       Respiratory   Asthma     Other   Prostate cancer screening   HYPERLIPIDEMIA   Gout   Vitamin D deficiency   Other Visit Diagnoses     Annual physical exam    -  Primary      No follow-ups on file.     Samuel Bouche, NP

## 2022-01-19 ENCOUNTER — Encounter: Payer: Managed Care, Other (non HMO) | Admitting: Osteopathic Medicine

## 2022-01-19 ENCOUNTER — Encounter: Payer: Self-pay | Admitting: Medical-Surgical

## 2022-01-19 ENCOUNTER — Ambulatory Visit (INDEPENDENT_AMBULATORY_CARE_PROVIDER_SITE_OTHER): Payer: Medicare Other | Admitting: Medical-Surgical

## 2022-01-19 VITALS — BP 132/88 | HR 66 | Resp 20 | Ht 71.0 in | Wt 219.1 lb

## 2022-01-19 DIAGNOSIS — Z125 Encounter for screening for malignant neoplasm of prostate: Secondary | ICD-10-CM

## 2022-01-19 DIAGNOSIS — J4520 Mild intermittent asthma, uncomplicated: Secondary | ICD-10-CM

## 2022-01-19 DIAGNOSIS — M109 Gout, unspecified: Secondary | ICD-10-CM | POA: Diagnosis not present

## 2022-01-19 DIAGNOSIS — E559 Vitamin D deficiency, unspecified: Secondary | ICD-10-CM | POA: Diagnosis not present

## 2022-01-19 DIAGNOSIS — E782 Mixed hyperlipidemia: Secondary | ICD-10-CM

## 2022-01-19 DIAGNOSIS — Z0000 Encounter for general adult medical examination without abnormal findings: Secondary | ICD-10-CM

## 2022-01-19 MED ORDER — ALBUTEROL SULFATE HFA 108 (90 BASE) MCG/ACT IN AERS: INHALATION_SPRAY | RESPIRATORY_TRACT | 99 refills | Status: DC

## 2022-01-19 MED ORDER — ALLOPURINOL 300 MG PO TABS: 300.0000 mg | ORAL_TABLET | Freq: Every day | ORAL | 3 refills | Status: DC

## 2022-01-19 MED ORDER — MONTELUKAST SODIUM 10 MG PO TABS
10.0000 mg | ORAL_TABLET | Freq: Every day | ORAL | 3 refills | Status: DC
Start: 2022-01-19 — End: 2023-01-12

## 2022-01-19 MED ORDER — ROSUVASTATIN CALCIUM 10 MG PO TABS: 10.0000 mg | ORAL_TABLET | Freq: Every day | ORAL | 3 refills | Status: DC

## 2022-01-19 NOTE — Assessment & Plan Note (Signed)
Stable with as needed use of albuterol inhaler.  Refilling inhaler with printed prescription per patient request.

## 2022-01-19 NOTE — Assessment & Plan Note (Signed)
Intolerant to pravastatin at 80 mg due to myalgias.  Has been taking 40 mg daily but would like to investigate switching to rosuvastatin as a friend of his is on it and highly recommends it.  Discontinue pravastatin.  Start rosuvastatin 10 mg daily.  Checking lipid panel today.  Continue Krill oil.

## 2022-01-19 NOTE — Assessment & Plan Note (Signed)
Continue allopurinol 300 mg daily as prescribed.  Refill via printed prescription per patient request.  Checking CMP and uric acid.

## 2022-01-19 NOTE — Assessment & Plan Note (Signed)
Checking vitamin-D.

## 2022-01-20 LAB — LIPID PANEL
Cholesterol: 218 mg/dL — ABNORMAL HIGH (ref <200)
HDL: 52 mg/dL (ref >=40)
LDL Cholesterol (Calc): 141 mg/dL (calc) — ABNORMAL HIGH
Non-HDL Cholesterol (Calc): 166 mg/dL (calc) — ABNORMAL HIGH (ref <130)
Total CHOL/HDL Ratio: 4.2 (calc) (ref <5.0)
Triglycerides: 129 mg/dL (ref <150)

## 2022-01-20 LAB — CBC WITH DIFFERENTIAL/PLATELET
Absolute Monocytes: 446 cells/uL (ref 200–950)
Basophils Absolute: 32 cells/uL (ref 0–200)
Basophils Relative: 0.7 %
Eosinophils Absolute: 50 cells/uL (ref 15–500)
Eosinophils Relative: 1.1 %
HCT: 47.4 % (ref 38.5–50.0)
Hemoglobin: 16.5 g/dL (ref 13.2–17.1)
Lymphs Abs: 1598 cells/uL (ref 850–3900)
MCH: 33.5 pg — ABNORMAL HIGH (ref 27.0–33.0)
MCHC: 34.8 g/dL (ref 32.0–36.0)
MCV: 96.3 fL (ref 80.0–100.0)
MPV: 9.4 fL (ref 7.5–12.5)
Monocytes Relative: 9.9 %
Neutro Abs: 2376 cells/uL (ref 1500–7800)
Neutrophils Relative %: 52.8 %
Platelets: 236 10*3/uL (ref 140–400)
RBC: 4.92 10*6/uL (ref 4.20–5.80)
RDW: 12.7 % (ref 11.0–15.0)
Total Lymphocyte: 35.5 %
WBC: 4.5 10*3/uL (ref 3.8–10.8)

## 2022-01-20 LAB — URIC ACID: Uric Acid, Serum: 4.4 mg/dL (ref 4.0–8.0)

## 2022-01-20 LAB — COMPLETE METABOLIC PANEL WITH GFR
AG Ratio: 1.8 (calc) (ref 1.0–2.5)
ALT: 18 U/L (ref 9–46)
AST: 21 U/L (ref 10–35)
Albumin: 4.3 g/dL (ref 3.6–5.1)
Alkaline phosphatase (APISO): 56 U/L (ref 35–144)
BUN: 14 mg/dL (ref 7–25)
CO2: 28 mmol/L (ref 20–32)
Calcium: 9.8 mg/dL (ref 8.6–10.3)
Chloride: 104 mmol/L (ref 98–110)
Creat: 0.95 mg/dL (ref 0.70–1.35)
Globulin: 2.4 g/dL (calc) (ref 1.9–3.7)
Glucose, Bld: 99 mg/dL (ref 65–99)
Potassium: 4.9 mmol/L (ref 3.5–5.3)
Sodium: 140 mmol/L (ref 135–146)
Total Bilirubin: 0.7 mg/dL (ref 0.2–1.2)
Total Protein: 6.7 g/dL (ref 6.1–8.1)
eGFR: 89 mL/min/{1.73_m2} (ref >=60)

## 2022-01-20 LAB — VITAMIN D 25 HYDROXY (VIT D DEFICIENCY, FRACTURES): Vit D, 25-Hydroxy: 35 ng/mL (ref 30–100)

## 2022-02-01 ENCOUNTER — Other Ambulatory Visit: Payer: Self-pay | Admitting: Family Medicine

## 2022-03-18 ENCOUNTER — Telehealth: Payer: Self-pay | Admitting: Medical-Surgical

## 2022-03-18 NOTE — Telephone Encounter (Addendum)
Patient called and requested a referral for urology he stated he has discussed the issue with you a few months ago. Please advise.   Jenny Reichmann

## 2022-03-18 NOTE — Telephone Encounter (Signed)
Patient called and requested a referral to urology he stated you talked about the issue a couple months ago. Please advise.   Jenny Reichmann

## 2022-03-25 NOTE — Telephone Encounter (Signed)
Patient has an appointment schedule with you 05/05/2022

## 2022-03-26 NOTE — Telephone Encounter (Signed)
Patient scheduled with Dr T tomorrow.

## 2022-03-26 NOTE — Telephone Encounter (Signed)
Patient scheduled.

## 2022-03-27 ENCOUNTER — Encounter: Payer: Self-pay | Admitting: Sports Medicine

## 2022-03-27 ENCOUNTER — Other Ambulatory Visit: Payer: Self-pay | Admitting: Sports Medicine

## 2022-03-27 ENCOUNTER — Ambulatory Visit (INDEPENDENT_AMBULATORY_CARE_PROVIDER_SITE_OTHER): Payer: Medicare Other | Admitting: Sports Medicine

## 2022-03-27 ENCOUNTER — Ambulatory Visit (INDEPENDENT_AMBULATORY_CARE_PROVIDER_SITE_OTHER): Payer: Medicare Other

## 2022-03-27 DIAGNOSIS — N433 Hydrocele, unspecified: Secondary | ICD-10-CM | POA: Insufficient documentation

## 2022-03-27 DIAGNOSIS — N5089 Other specified disorders of the male genital organs: Secondary | ICD-10-CM

## 2022-03-27 HISTORY — DX: Hydrocele, unspecified: N43.3

## 2022-03-27 NOTE — Assessment & Plan Note (Signed)
This is a very pleasant 66 year old male, for the past year he has noted a nontender mass right hemiscrotum. On exam he has a softball sized mass right hemiscrotum, nothing on the hernia exam, no inguinal lymphadenopathy.  Unclear whether this is a hydrocele or a solid testicular mass, we will proceed with ultrasound.

## 2022-03-27 NOTE — Progress Notes (Addendum)
    Procedures performed today:    None.  Independent interpretation of notes and tests performed by another provider:   I did personally review the ultrasound images, it does look like he has more of a hydrocele.  Awaiting official report.  Brief History, Exam, Impression, and Recommendations:    Mass of scrotum This is a very pleasant 66 year old male, for the past year he has noted a nontender mass right hemiscrotum. On exam he has a softball sized mass right hemiscrotum, nothing on the hernia exam, no inguinal lymphadenopathy.  Unclear whether this is a hydrocele or a solid testicular mass, we will proceed with ultrasound.  New diagnosis with uncertain prognosis.  ____________________________________________ Gwen Her. Dianah Field, M.D., ABFM., CAQSM., AME. Primary Care and Sports Medicine [place] [facility]  Adjunct Professor of [employer] [facility] of Medicine  Risk manager

## 2022-04-03 ENCOUNTER — Encounter: Payer: Self-pay | Admitting: Sports Medicine

## 2022-04-14 ENCOUNTER — Ambulatory Visit (INDEPENDENT_AMBULATORY_CARE_PROVIDER_SITE_OTHER): Payer: Medicare Other | Admitting: Sports Medicine

## 2022-04-14 DIAGNOSIS — N432 Other hydrocele: Secondary | ICD-10-CM | POA: Diagnosis not present

## 2022-04-14 NOTE — Assessment & Plan Note (Signed)
Pleasant 66 year old male, for the past year he is noted a nontender mass right hemiscrotum, he had a softball sized mass, ultrasound showed bilateral hydrocele right worse than left, today we drained the hydrocele, we did give him the option to see urologist for sclerosant injection as well. See procedure note above. He will apply compression for the next week, avoid lifting more than 10 to 15 pounds for the next week. And he can return to see [place] as needed. There was an additional left-sided hydrocele which I am happy to drain in the future if you would like. The other option is to see a urologist for sclerosant injection particularly if this recurs.

## 2022-04-14 NOTE — Progress Notes (Addendum)
    Procedures performed today:    Procedure: Aspiration of right hemiscrotal hydrocele. Consent obtained and verified. Time-out conducted. Noted no overlying erythema, induration, or other signs of local infection. Skin prepped in a sterile fashion. Topical analgesic spray: Ethyl chloride and approximately 2 mL of subcutaneous lidocaine. Using strict sterile technique I advanced a 14-gauge angiocatheter into the hemiscrotum with ultrasound guidance, the needle was removed and we were able to express approximately 135 mL of clear, straw-colored fluid. Completed without difficulty. Dermabond applied to wound after holding pressure. Advised to call if fevers/chills, erythema, induration, drainage, or persistent bleeding.  Independent interpretation of notes and tests performed by another provider:   None.  Brief History, Exam, Impression, and Recommendations:    Hydrocele David Melendez 66 year old male, for the past year he is noted a nontender mass right hemiscrotum, he had a softball sized mass, ultrasound showed bilateral hydrocele right worse than left, today we drained the hydrocele, we did give him the option to see urologist for sclerosant injection as well. See procedure note above. He will apply compression for the next week, avoid lifting more than 10 to 15 pounds for the next week. And he can return to see [place] as needed. There was an additional left-sided hydrocele which I am happy to drain in the future if you would like. The other option is to see a urologist for sclerosant injection particularly if this recurs.    ____________________________________________ Gwen Her. Dianah Field, M.D., ABFM., CAQSM., AME. Primary Care and Sports Medicine [place] [facility]  Adjunct Professor of [place] of [facility] of Medicine  Risk manager

## 2022-05-05 ENCOUNTER — Ambulatory Visit (INDEPENDENT_AMBULATORY_CARE_PROVIDER_SITE_OTHER): Payer: Medicare Other | Admitting: Medical-Surgical

## 2022-05-05 ENCOUNTER — Encounter: Payer: Self-pay | Admitting: Medical-Surgical

## 2022-05-05 VITALS — BP 148/86 | HR 55 | Resp 20 | Ht 71.0 in | Wt 226.1 lb

## 2022-05-05 DIAGNOSIS — Z0000 Encounter for general adult medical examination without abnormal findings: Secondary | ICD-10-CM | POA: Diagnosis not present

## 2022-05-05 DIAGNOSIS — Z23 Encounter for immunization: Secondary | ICD-10-CM

## 2022-05-05 DIAGNOSIS — I493 Ventricular premature depolarization: Secondary | ICD-10-CM
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
## 2022-05-05 NOTE — Patient Instructions (Signed)
  David Melendez , Thank you for taking time to come for your Medicare Wellness Visit. I appreciate your ongoing commitment to your health goals. Please review the following plan we discussed and let me know if I can assist you in the future.   These are the goals we discussed:  Goals      Weight (lb) < 210 lb (95.3 kg)        This is a list of the screening recommended for you and due dates:  Health Maintenance  Topic Date Due   COVID-19 Vaccine (3 - Moderna series) 05/21/2022*   Flu Shot  11/15/2022*   Pneumonia Vaccine (1 - PCV) 05/06/2023*   Colon Cancer Screening  08/08/2023   Tetanus Vaccine  12/29/2023   Hepatitis C Screening: USPSTF Recommendation to screen - Ages 18-79 yo.  Completed   HIV Screening  Completed   Zoster (Shingles) Vaccine  Completed   HPV Vaccine  Aged Out  *Topic was postponed. The date shown is not the original due date.

## 2022-06-01 ENCOUNTER — Encounter: Payer: Self-pay | Admitting: Sports Medicine

## 2022-06-01 ENCOUNTER — Ambulatory Visit (INDEPENDENT_AMBULATORY_CARE_PROVIDER_SITE_OTHER): Payer: Medicare Other | Admitting: Sports Medicine

## 2022-06-01 DIAGNOSIS — N432 Other hydrocele: Secondary | ICD-10-CM

## 2022-06-01 NOTE — Assessment & Plan Note (Signed)
This is a pleasant 66 year old male, he has a ultrasound confirmed hydrocele, we did a therapeutic aspiration 140 mL out at the last visit, we did not use a sclerosant. He understands he needs to see a urologist for that, referral placed.

## 2022-06-01 NOTE — Progress Notes (Signed)
    Procedures performed today:    None.  Independent interpretation of notes and tests performed by another provider:   None.  Brief History, Exam, Impression, and Recommendations:    Hydrocele This is a pleasant 66 year old male, he has a ultrasound confirmed hydrocele, we did a therapeutic aspiration 140 mL out at the last visit, we did not use a sclerosant. He understands he needs to see a urologist for that, referral placed.    ____________________________________________ Gwen Her. Dianah Field, M.D., ABFM., CAQSM., AME. Primary Care and Sports Medicine [facility] [facility]  Adjunct Professor of [employer]  Risk manager

## 2022-06-12 DIAGNOSIS — Z83719 Family history of colon polyps, unspecified: Secondary | ICD-10-CM | POA: Insufficient documentation

## 2022-06-12 DIAGNOSIS — B019 Varicella without complication: Secondary | ICD-10-CM | POA: Insufficient documentation

## 2022-06-25 ENCOUNTER — Encounter: Payer: Self-pay | Admitting: Cardiology

## 2022-06-25 ENCOUNTER — Ambulatory Visit: Payer: Medicare Other | Attending: Cardiology | Admitting: Cardiology

## 2022-06-25 VITALS — BP 132/74 | HR 71 | Ht 71.0 in | Wt 222.0 lb

## 2022-06-25 DIAGNOSIS — R9431 Abnormal electrocardiogram [ECG] [EKG]: Secondary | ICD-10-CM

## 2022-06-25 DIAGNOSIS — E782 Mixed hyperlipidemia: Secondary | ICD-10-CM | POA: Diagnosis not present

## 2022-06-25 DIAGNOSIS — Z8249 Family history of ischemic heart disease and other diseases of the circulatory system: Secondary | ICD-10-CM | POA: Insufficient documentation

## 2022-06-25 DIAGNOSIS — I493 Ventricular premature depolarization: Secondary | ICD-10-CM

## 2022-06-25 DIAGNOSIS — R0609 Other forms of dyspnea: Secondary | ICD-10-CM

## 2022-06-25 HISTORY — DX: Other forms of dyspnea: R06.09

## 2022-06-25 HISTORY — DX: Ventricular premature depolarization: I49.3

## 2022-06-25 HISTORY — DX: Abnormal electrocardiogram (ECG) (EKG): R94.31

## 2022-06-25 NOTE — Patient Instructions (Signed)
Medication Instructions:  Your physician recommends that you continue on your current medications as directed. Please refer to the Current Medication list given to you today.  *If you need a refill on your cardiac medications before your next appointment, please call your pharmacy*   Lab Work: None ordered If you have labs (blood work) drawn today and your tests are completely normal, you will receive your results only by: [place] (if you have MyChart) OR A paper copy in the mail If you have any lab test that is abnormal or we need to change your treatment, we will call you to review the results.   Testing/Procedures:      Stress Echocardiogram Information Sheet                                                      Instructions:    1. You may take your morning medications the morning of the test  2. Light breakfast.  3. Dress prepared to exercise.  4. DO NOT use ANY caffeine or tobacco products 3 hours before appointment.  5. Please bring all current prescription medications.    Follow-Up: At [facility], you and your health needs are our priority.  As part of our continuing mission to provide you with exceptional heart care, we have created designated Provider Care Teams.  These Care Teams include your primary Cardiologist (physician) and Advanced Practice Providers (APPs -  Physician Assistants and Nurse Practitioners) who all work together to provide you with the care you need, when you need it.  We recommend signing up for the patient portal called "MyChart".  Sign up information is provided on this After Visit Summary.  MyChart is used to connect with patients for Virtual Visits (Telemedicine).  Patients are able to view lab/test results, encounter notes, upcoming appointments, etc.  Non-urgent messages can be sent to your provider as well.   To learn more about what you can do with MyChart, go to [email or web address].    Your next appointment:   12  month(s)  The format for your next appointment:   In Person  Provider:   Janace Hoard, MD   Other Instructions Exercise Stress Echocardiogram An exercise stress echocardiogram is a test to check how well your heart is working. This test uses sound waves and a computer to make pictures of your heart. These pictures will be taken before and after you exercise. For this test, you will walk on a treadmill or ride a bicycle to make your heart beat faster. While you exercise, your heart will be checked with an electrocardiogram (ECG). Your blood pressure will also be checked. You may have this test if: You have chest pain or a heart problem. You had a heart attack or heart surgery not long ago. You have heart valve problems. You have a condition that causes narrowing of the blood vessels that supply your heart. You have a high risk of heart disease and: You are starting a new exercise program. You need to have a big surgery. Tell a doctor about: Any allergies you have. All medicines you are taking. This includes vitamins, herbs, eye drops, creams, and over-the-counter medicines. Any problems you or family members have had with medicines that make you fall asleep (anesthetic medicines). Any surgeries you have had. Any blood disorders you have. Any medical  conditions you have. Whether you are pregnant or Matlin be pregnant. What are the risks? Generally, this is a safe test. However, problems Matthes occur, including: Chest pain. Feeling dizzy or light-headed. Shortness of breath. Increased or irregular heartbeat. Feeling like you Melaragno vomit (nausea) or vomiting. Heart attack. This is very rare. What happens before the test? Medicines Ask your doctor about changing or stopping your normal medicines. This is important if you take diabetes medicines or blood thinners. If you use an inhaler, bring it to the test. General instructions Wear comfortable clothes and walking shoes. Follow  instructions from your doctor about what you cannot eat or drink before the test. Do not drink or eat anything that has caffeine in it. Stop having caffeine 24 hours before the test. Do not smoke or use products that contain nicotine or tobacco for 4 hours before the test. If you need help quitting, ask your doctor. What happens during the test?  You will take off your clothes from the waist up and put on a hospital gown. Electrodes or patches will be put on your chest. A blood pressure cuff will be put on your arm. Before you exercise, a computer will make a picture of your heart. To do this: You will lie down and a gel will be put on your chest. A wand will be moved over the gel. Sound waves from the wand will go to the computer to make the picture. Then, you will start to exercise. You Liby walk on a treadmill or pedal a bicycle. Your blood pressure and heart rhythm will be checked while you exercise. The exercise will get harder or faster. You will exercise until: Your heart reaches a certain level. You are too tired to go on. You cannot go on because of chest pain, weakness, or dizziness. You will lie down right away so another picture of your heart can be taken. The procedure Labrum vary among doctors and hospitals. What can I expect after the test? After your test, it is common to have: Mild soreness. Mild tiredness. Your heart rate and blood pressure will be checked until they return to your normal levels. You should not have any new symptoms after this test. Follow these instructions at home: If your doctor says that you can, you Panos: Eat what you normally eat. Do your normal activities. Take over-the-counter and prescription medicines only as told by your doctor. Keep all follow-up visits. It is up to you to get the results of your test. Ask how to get your results when they are ready. Contact a doctor if: You feel dizzy or light-headed. You have a fast or irregular  heartbeat. You feel like you Wiater vomit or you vomit. You have a headache. You feel short of breath. Get help right away if: You develop pain or pressure: In your chest. In your jaw or neck. Between your shoulders. That goes down your left arm. You faint. You have trouble breathing. These symptoms Mungin be an emergency. Get medical help right away. Call your local emergency services (911 in the [place] Do not wait to see if the symptoms will go away. Do not drive yourself to the hospital. Summary This is a test that checks how well your heart is working. Follow instructions about what you cannot eat or drink before the test. Ask your doctor if you should take your normal medicines before the test. Stop having caffeine 24 hours before the test. Do not smoke or use products with nicotine  or tobacco in them for 4 hours before the test. During the test, your blood pressure and heart rhythm will be checked while you exercise. This information is not intended to replace advice given to you by your health care provider. Make sure you discuss any questions you have with your health care provider. Document Revised: 04/16/2021 Document Reviewed: 03/26/2020 Elsevier Patient Education  2022 [place].  We will order CT coronary calcium score. It will cost $99.00 and is not covered by insurance.  Please call to schedule.     [facility] [place] [address], [place] [address] [phone]  - [place]
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
## 2022-06-30 ENCOUNTER — Ambulatory Visit (HOSPITAL_BASED_OUTPATIENT_CLINIC_OR_DEPARTMENT_OTHER)
Admission: RE | Admit: 2022-06-30 | Discharge: 2022-06-30 | Disposition: A | Discharge status: Home / Self Care | Payer: Medicare Other | Source: Ambulatory Visit | Attending: Cardiology | Admitting: Cardiology

## 2022-06-30 DIAGNOSIS — Z8249 Family history of ischemic heart disease and other diseases of the circulatory system: Secondary | ICD-10-CM | POA: Insufficient documentation

## 2022-07-03 ENCOUNTER — Other Ambulatory Visit: Payer: Self-pay | Admitting: Urology

## 2022-07-06 ENCOUNTER — Telehealth: Payer: Self-pay

## 2022-07-06 DIAGNOSIS — R931 Abnormal findings on diagnostic imaging of heart and coronary circulation: Secondary | ICD-10-CM

## 2022-07-06 DIAGNOSIS — I7091 Generalized atherosclerosis: Secondary | ICD-10-CM

## 2022-07-06 NOTE — Telephone Encounter (Signed)
-----   Message from Jenean Lindau, MD sent at 07/03/2022  2:27 PM EST ----- Elevated calcium score.  Please bring patient in for follow-up Liver Lipid Check.  Copy Primary Jenean Lindau, MD 07/03/2022 2:27 PM

## 2022-07-08 ENCOUNTER — Telehealth: Payer: Self-pay

## 2022-07-08 ENCOUNTER — Encounter (HOSPITAL_BASED_OUTPATIENT_CLINIC_OR_DEPARTMENT_OTHER): Payer: Self-pay | Admitting: Urology

## 2022-07-08 DIAGNOSIS — E782 Mixed hyperlipidemia: Secondary | ICD-10-CM

## 2022-07-08 DIAGNOSIS — R931 Abnormal findings on diagnostic imaging of heart and coronary circulation: Secondary | ICD-10-CM

## 2022-07-08 LAB — LIPID PANEL
Chol/HDL Ratio: 4.9 ratio (ref 0.0–5.0)
Cholesterol, Total: 182 mg/dL (ref 100–199)
HDL: 37 mg/dL — ABNORMAL LOW (ref >39)
LDL Chol Calc (NIH): 119 mg/dL — ABNORMAL HIGH (ref 0–99)
Triglycerides: 146 mg/dL (ref 0–149)
VLDL Cholesterol Cal: 26 mg/dL (ref 5–40)

## 2022-07-08 LAB — BASIC METABOLIC PANEL
BUN/Creatinine Ratio: 15 (ref 10–24)
BUN: 13 mg/dL (ref 8–27)
CO2: 24 mmol/L (ref 20–29)
Calcium: 9.5 mg/dL (ref 8.6–10.2)
Chloride: 102 mmol/L (ref 96–106)
Creatinine, Ser: 0.88 mg/dL (ref 0.76–1.27)
Glucose: 92 mg/dL (ref 70–99)
Potassium: 4.5 mmol/L (ref 3.5–5.2)
Sodium: 140 mmol/L (ref 134–144)
eGFR: 95 mL/min/{1.73_m2} (ref >59)

## 2022-07-08 LAB — HEPATIC FUNCTION PANEL
ALT: 28 IU/L (ref 0–44)
AST: 23 IU/L (ref 0–40)
Albumin: 4.3 g/dL (ref 3.9–4.9)
Alkaline Phosphatase: 65 IU/L (ref 44–121)
Bilirubin Total: 0.7 mg/dL (ref 0.0–1.2)
Bilirubin, Direct: 0.15 mg/dL (ref 0.00–0.40)
Total Protein: 6.7 g/dL (ref 6.0–8.5)

## 2022-07-08 MED ORDER — ROSUVASTATIN CALCIUM 20 MG PO TABS: 20.0000 mg | ORAL_TABLET | Freq: Every day | ORAL | 3 refills | Status: DC

## 2022-07-08 NOTE — Telephone Encounter (Signed)
Results reviewed with pt as per Dr. Revankar's note.  Pt verbalized understanding and had no additional questions. Routed to PCP.  

## 2022-07-08 NOTE — Progress Notes (Signed)
Spoke w/ via phone for pre-op interview--- Louie Casa Lab needs dos----  NONE             Lab results------ Current EKG in Epic dated 06/25/22 COVID test -----patient states asymptomatic no test needed Arrive at -------0530 NPO after MN NO Solid Food.   Med rec completed Medications to take morning of surgery -----NONE Diabetic medication ----- Patient instructed no nail polish to be worn day of surgery Patient instructed to bring photo id and insurance card day of surgery Patient aware to have Driver (ride ) / caregiver Almyra Free wife   for 24 hours after surgery  Patient Special Instructions ----- Pre-Op special Istructions ----- Patient verbalized understanding of instructions that were given at this phone interview. Patient denies shortness of breath, chest pain, fever, cough at this phone interview.

## 2022-07-08 NOTE — Telephone Encounter (Signed)
-----   Message from Jenean Lindau, MD sent at 07/08/2022  8:19 AM EST ----- Markedly elevated lipids.  Goal LDL must be less than 60.  Double statin.  Diet and liver lipid check in 6 weeks.  Copy primary care Jenean Lindau, MD 07/08/2022 8:19 AM

## 2022-07-15 NOTE — Anesthesia Preprocedure Evaluation (Signed)
Anesthesia Evaluation  Patient identified by MRN, date of birth, ID band Patient awake    Reviewed: Allergy & Precautions, H&P , NPO status , Patient's Chart, lab work & pertinent test results  Airway Mallampati: II  TM Distance: >3 FB Neck ROM: Full    Dental no notable dental hx. (+) Teeth Intact, Dental Advisory Given   Pulmonary asthma , former smoker   Pulmonary exam normal breath sounds clear to auscultation       Cardiovascular Exercise Tolerance: Good + DOE   Rhythm:Regular Rate:Normal     Neuro/Psych negative neurological ROS  negative psych ROS   GI/Hepatic negative GI ROS, Neg liver ROS,,,  Endo/Other  negative endocrine ROS    Renal/GU negative Renal ROS  negative genitourinary   Musculoskeletal   Abdominal   Peds  Hematology negative hematology ROS (+)   Anesthesia Other Findings   Reproductive/Obstetrics negative OB ROS                             Anesthesia Physical Anesthesia Plan  ASA: 2  Anesthesia Plan: General   Post-op Pain Management: Tylenol PO (pre-op)* and Toradol IV (intra-op)*   Induction: Intravenous  PONV Risk Score and Plan: 3 and Ondansetron, Dexamethasone and Midazolam  Airway Management Planned: LMA  Additional Equipment:   Intra-op Plan:   Post-operative Plan: Extubation in OR  Informed Consent: I have reviewed the patients History and Physical, chart, labs and discussed the procedure including the risks, benefits and alternatives for the proposed anesthesia with the patient or authorized representative who has indicated his/her understanding and acceptance.     Dental advisory given  Plan Discussed with: CRNA  Anesthesia Plan Comments:        Anesthesia Quick Evaluation

## 2022-07-16 ENCOUNTER — Ambulatory Visit (HOSPITAL_BASED_OUTPATIENT_CLINIC_OR_DEPARTMENT_OTHER): Payer: Medicare Other | Admitting: Anesthesiology

## 2022-07-16 ENCOUNTER — Ambulatory Visit (HOSPITAL_BASED_OUTPATIENT_CLINIC_OR_DEPARTMENT_OTHER)
Admission: RE | Admit: 2022-07-16 | Discharge: 2022-07-16 | Disposition: A | Discharge status: Home / Self Care | Payer: Medicare Other | Attending: Urology | Admitting: Urology

## 2022-07-16 ENCOUNTER — Other Ambulatory Visit: Payer: Self-pay

## 2022-07-16 ENCOUNTER — Encounter (HOSPITAL_BASED_OUTPATIENT_CLINIC_OR_DEPARTMENT_OTHER): Payer: Self-pay | Admitting: Urology

## 2022-07-16 ENCOUNTER — Encounter (HOSPITAL_BASED_OUTPATIENT_CLINIC_OR_DEPARTMENT_OTHER)
Admission: RE | Disposition: A | Discharge status: Home / Self Care | Payer: Self-pay | Source: Home / Self Care | Attending: Urology

## 2022-07-16 DIAGNOSIS — J45909 Unspecified asthma, uncomplicated: Secondary | ICD-10-CM | POA: Diagnosis not present

## 2022-07-16 DIAGNOSIS — Z87891 Personal history of nicotine dependence: Secondary | ICD-10-CM | POA: Diagnosis not present

## 2022-07-16 DIAGNOSIS — N433 Hydrocele, unspecified: Secondary | ICD-10-CM | POA: Insufficient documentation

## 2022-07-16 HISTORY — PX: HYDROCELE EXCISION: SHX482

## 2022-07-16 SURGERY — HYDROCELECTOMY
Anesthesia: General | Site: Scrotum | Laterality: Right

## 2022-07-16 MED ORDER — LACTATED RINGERS IV SOLN: INTRAVENOUS | Status: DC

## 2022-07-16 MED ORDER — ACETAMINOPHEN 500 MG PO TABS
ORAL_TABLET | ORAL | Status: AC
  Filled 2022-07-16: qty 2

## 2022-07-16 MED ORDER — CEFAZOLIN SODIUM-DEXTROSE 2-4 GM/100ML-% IV SOLN
INTRAVENOUS | Status: AC
  Filled 2022-07-16: qty 100

## 2022-07-16 MED ORDER — PROPOFOL 10 MG/ML IV BOLUS
INTRAVENOUS | Status: DC | PRN
  Administered 2022-07-16: 200 mg via INTRAVENOUS
  Administered 2022-07-16: 50 mg via INTRAVENOUS

## 2022-07-16 MED ORDER — BUPIVACAINE HCL (PF) 0.25 % IJ SOLN
INTRAMUSCULAR | Status: DC | PRN
  Administered 2022-07-16: 16 mL

## 2022-07-16 MED ORDER — PROPOFOL 10 MG/ML IV BOLUS
INTRAVENOUS | Status: AC
  Filled 2022-07-16: qty 20

## 2022-07-16 MED ORDER — LIDOCAINE HCL (PF) 2 % IJ SOLN
INTRAMUSCULAR | Status: AC
  Filled 2022-07-16: qty 5

## 2022-07-16 MED ORDER — ONDANSETRON HCL 4 MG/2ML IJ SOLN
INTRAMUSCULAR | Status: AC
  Filled 2022-07-16: qty 2

## 2022-07-16 MED ORDER — FENTANYL CITRATE (PF) 100 MCG/2ML IJ SOLN: 25.0000 ug | INTRAMUSCULAR | Status: DC | PRN

## 2022-07-16 MED ORDER — ARTIFICIAL TEARS OPHTHALMIC OINT
TOPICAL_OINTMENT | OPHTHALMIC | Status: AC
  Filled 2022-07-16: qty 3.5

## 2022-07-16 MED ORDER — FENTANYL CITRATE (PF) 100 MCG/2ML IJ SOLN
INTRAMUSCULAR | Status: AC
  Filled 2022-07-16: qty 2

## 2022-07-16 MED ORDER — MIDAZOLAM HCL 2 MG/2ML IJ SOLN
INTRAMUSCULAR | Status: AC
  Filled 2022-07-16: qty 2

## 2022-07-16 MED ORDER — CEFAZOLIN SODIUM-DEXTROSE 2-4 GM/100ML-% IV SOLN
2.0000 g | INTRAVENOUS | Status: AC
  Administered 2022-07-16: 2 g via INTRAVENOUS

## 2022-07-16 MED ORDER — LIDOCAINE 2% (20 MG/ML) 5 ML SYRINGE
INTRAMUSCULAR | Status: DC | PRN
  Administered 2022-07-16: 60 mg via INTRAVENOUS

## 2022-07-16 MED ORDER — DEXAMETHASONE SODIUM PHOSPHATE 10 MG/ML IJ SOLN
INTRAMUSCULAR | Status: AC
  Filled 2022-07-16: qty 1

## 2022-07-16 MED ORDER — PHENYLEPHRINE 80 MCG/ML (10ML) SYRINGE FOR IV PUSH (FOR BLOOD PRESSURE SUPPORT)
PREFILLED_SYRINGE | INTRAVENOUS | Status: AC
  Filled 2022-07-16: qty 20

## 2022-07-16 MED ORDER — KETOROLAC TROMETHAMINE 30 MG/ML IJ SOLN
INTRAMUSCULAR | Status: DC | PRN
  Administered 2022-07-16: 30 mg via INTRAVENOUS

## 2022-07-16 MED ORDER — EPHEDRINE SULFATE-NACL 50-0.9 MG/10ML-% IV SOSY
PREFILLED_SYRINGE | INTRAVENOUS | Status: DC | PRN
  Administered 2022-07-16: 15 mg via INTRAVENOUS

## 2022-07-16 MED ORDER — EPHEDRINE 5 MG/ML INJ
INTRAVENOUS | Status: AC
  Filled 2022-07-16: qty 5

## 2022-07-16 MED ORDER — KETOROLAC TROMETHAMINE 30 MG/ML IJ SOLN
INTRAMUSCULAR | Status: AC
  Filled 2022-07-16: qty 2

## 2022-07-16 MED ORDER — TRAMADOL HCL 50 MG PO TABS: 50.0000 mg | ORAL_TABLET | Freq: Four times a day (QID) | ORAL | 0 refills | Status: DC | PRN

## 2022-07-16 MED ORDER — ACETAMINOPHEN 500 MG PO TABS
1000.0000 mg | ORAL_TABLET | Freq: Once | ORAL | Status: AC
  Administered 2022-07-16: 1000 mg via ORAL

## 2022-07-16 MED ORDER — DEXAMETHASONE SODIUM PHOSPHATE 10 MG/ML IJ SOLN
INTRAMUSCULAR | Status: DC | PRN
  Administered 2022-07-16: 10 mg via INTRAVENOUS

## 2022-07-16 MED ORDER — PHENYLEPHRINE 80 MCG/ML (10ML) SYRINGE FOR IV PUSH (FOR BLOOD PRESSURE SUPPORT)
PREFILLED_SYRINGE | INTRAVENOUS | Status: AC
  Filled 2022-07-16: qty 10

## 2022-07-16 MED ORDER — FENTANYL CITRATE (PF) 100 MCG/2ML IJ SOLN
INTRAMUSCULAR | Status: DC | PRN
  Administered 2022-07-16: 100 ug via INTRAVENOUS

## 2022-07-16 MED ORDER — ONDANSETRON HCL 4 MG/2ML IJ SOLN
INTRAMUSCULAR | Status: DC | PRN
  Administered 2022-07-16: 4 mg via INTRAVENOUS

## 2022-07-16 MED ORDER — PHENYLEPHRINE 80 MCG/ML (10ML) SYRINGE FOR IV PUSH (FOR BLOOD PRESSURE SUPPORT)
PREFILLED_SYRINGE | INTRAVENOUS | Status: DC | PRN
  Administered 2022-07-16 (×2): 160 ug via INTRAVENOUS

## 2022-07-16 MED ORDER — MIDAZOLAM HCL 5 MG/5ML IJ SOLN
INTRAMUSCULAR | Status: DC | PRN
  Administered 2022-07-16: 2 mg via INTRAVENOUS

## 2022-07-16 MED ORDER — 0.9 % SODIUM CHLORIDE (POUR BTL) OPTIME
TOPICAL | Status: DC | PRN
  Administered 2022-07-16: 500 mL

## 2022-07-16 SURGICAL SUPPLY — 37 items
ADH SKN CLS APL DERMABOND .7 (GAUZE/BANDAGES/DRESSINGS) ×1
BLADE CLIPPER SENSICLIP SURGIC (BLADE) IMPLANT
BLADE SURG 15 STRL LF DISP TIS (BLADE) ×1 IMPLANT
BLADE SURG 15 STRL SS (BLADE) ×1
BNDG GAUZE DERMACEA FLUFF 4 (GAUZE/BANDAGES/DRESSINGS) ×1 IMPLANT
BNDG GZE DERMACEA 4 6PLY (GAUZE/BANDAGES/DRESSINGS) ×1
CLEANER CAUTERY TIP 5X5 PAD (MISCELLANEOUS) ×1 IMPLANT
COVER BACK TABLE 60X90IN (DRAPES) ×1 IMPLANT
COVER MAYO STAND STRL (DRAPES) ×1 IMPLANT
DERMABOND ADVANCED .7 DNX12 (GAUZE/BANDAGES/DRESSINGS) ×1 IMPLANT
DISSECTOR ROUND CHERRY 3/8 STR (MISCELLANEOUS) IMPLANT
DRAIN PENROSE 0.25X18 (DRAIN) ×1 IMPLANT
DRAPE LAPAROTOMY 100X72 PEDS (DRAPES) ×1 IMPLANT
ELECT NDL TIP 2.8 STRL (NEEDLE) IMPLANT
ELECT NEEDLE TIP 2.8 STRL (NEEDLE) IMPLANT
ELECT REM PT RETURN 9FT ADLT (ELECTROSURGICAL)
ELECTRODE REM PT RTRN 9FT ADLT (ELECTROSURGICAL) IMPLANT
GAUZE 4X4 16PLY ~~LOC~~+RFID DBL (SPONGE) ×1 IMPLANT
GLOVE BIO SURGEON STRL SZ8 (GLOVE) ×1 IMPLANT
GOWN STRL REUS W/TWL XL LVL3 (GOWN DISPOSABLE) ×2 IMPLANT
KIT TURNOVER CYSTO (KITS) ×1 IMPLANT
MANIFOLD NEPTUNE II (INSTRUMENTS) IMPLANT
NEEDLE HYPO 22GX1.5 SAFETY (NEEDLE) IMPLANT
NS IRRIG 500ML POUR BTL (IV SOLUTION) ×1 IMPLANT
PACK BASIN DAY SURGERY FS (CUSTOM PROCEDURE TRAY) ×1 IMPLANT
PENCIL SMOKE EVACUATOR (MISCELLANEOUS) ×1 IMPLANT
SUPPORT SCROTAL LG STRP (MISCELLANEOUS) ×1 IMPLANT
SUT CHROMIC 2 0 SH (SUTURE) ×1 IMPLANT
SUT CHROMIC 3 0 SH 27 (SUTURE) IMPLANT
SUT CHROMIC 4 0 SH 27 (SUTURE) IMPLANT
SUT MNCRL AB 4-0 PS2 18 (SUTURE) ×1 IMPLANT
SYR CONTROL 10ML LL (SYRINGE) IMPLANT
TOWEL OR 17X26 10 PK STRL BLUE (TOWEL DISPOSABLE) ×1 IMPLANT
TRAY DSU PREP LF (CUSTOM PROCEDURE TRAY) ×1 IMPLANT
TUBE CONNECTING 12X1/4 (SUCTIONS) ×1 IMPLANT
WATER STERILE IRR 500ML POUR (IV SOLUTION) ×1 IMPLANT
YANKAUER SUCT BULB TIP NO VENT (SUCTIONS) ×1 IMPLANT

## 2022-07-16 NOTE — Op Note (Signed)
Preoperative diagnosis: Right hydrocele   Postoperative diagnosis: Same   Procedure:Right hydrocele repair   Surgeon: Lillette Boxer. Mccall Will, M.D.   Anesthesia: Gen.   Indications: Patient presented with scrotal swelling. A hydrocele was confirmed with scrotal ultrasonography. The patient was symptomatic from his hydrocele and requested surgical intervention. He appeared to understand the risks benefits potential complications of this procedure.   Procedure: The patient was properly identified and marked in the holding room. He received preoperative IV antibiotics. He was then taken to the operating room where general anesthetic was administered using the LMA. The scrotum was then prepped and draped in the usual manner. Appropriate surgical timeout was performed. An incision was made in the median raphae of the scrotum. The hydrocele was encountered which was freed from the scrotal wall and then the testis and hydrocele sac were delivered from the incision. The hydrocele sac was then incised anteriorly and a large amount of fluid was obtained. The testis was carefully inspected and no other pathology was appreciated. The hydrocele sac was moderate in thickness.  The sac was then plicated posteriorly with a running 3-0 chromic suture. The spermatic cord block was then performed with  16 ml of 0.25% plain Marcaine. The testis was returned to the hemiscrotum taking great care to make sure that there was no twisting of the spermatic cord. Inspection of the inside of the scrotum revealed no significant bleeding. A quarter-inch Penrose was brought through the lower aspect of the scrotum into the affected hemiscrotum and sutured to the skin. The scrotal incision was then closed anatomically, first with a running 3-0 chromic reapproximating the dartos fascia, and then a 4-0 Monocryl used to close the skin in a simple running fashion. A fluff dressing and jockstrap was then placed Sponge and needle counts were  correct. No obvious complications occurred and the patient was brought to recovery room in stable condition.

## 2022-07-16 NOTE — Discharge Instructions (Addendum)
Scrotal surgery postoperative instructions  Wound:  In most cases your incision will have absorbable sutures that will dissolve within the first 2-3 weeks. Some will fall out even earlier. Expect some redness as the sutures dissolve but this should occur only around the sutures. If there is generalized redness, especially with increasing pain or swelling, let us know. The scrotum will very likely get "black and blue" as the blood in the tissues spread. Sometimes the whole scrotum will turn colors. The black and blue is followed by a yellow and brown color. In time, all the discoloration will go away. In some cases some firm swelling in the area of the testicle may persist for up to 4-6 weeks after the surgery and is considered normal in most cases.  Drain:  If the surgeon placed a drain through the bottom part of your scrotum, it is held in with a small stitch. When instructed (on Tues 12/5), cut the small stitch and slide to drain out. Once the drain has been removed, a small hole made drain out for another day or 2. If so, keep a clean washcloth underneath your supportive undergarment, or sterile gauze. Until the hole seals up, all bathing should be in the shower, and not in the bathtub.  Diet:  You may return to your normal diet within 24 hours following your surgery. You may note some mild nausea and possibly vomiting the first 6-8 hours following surgery. This is usually due to the side effects of anesthesia, and will disappear quite soon. I would suggest clear liquids and a very light meal the first evening following your surgery.  Activity:  Your physical activity should be restricted the first 48 hours. During that time you should remain relatively inactive, moving about only when necessary. During the first 7-10 days following surgery you should avoid lifting any heavy objects (anything greater than 15 pounds), and avoid strenuous exercise. If you work, ask [place] specifically about your  restrictions, both for work and home. We will write a note to your employer if needed.  You should plan to wear a tight pair of jockey shorts or an athletic supporter for the first 4-5 days, even to sleep. This will keep the scrotum immobilized to some degree and keep the swelling down. You may find it more comfortable to wear a support longer.  Ice packs should be placed on and off over the scrotum for the first 48 hours. Frozen peas or corn in a ZipLock bag can be frozen, used and re-frozen. Fifteen minutes on and 15 minutes off is a reasonable schedule. The ice is a good pain reliever and keeps the swelling down.  Hygiene:  You may shower 48 hours after your surgery. Tub bathing should be restricted until the seventh day.  Medication:  You will be sent home with some type of pain medication. In many cases you will be sent home with a narcotic pain pill (Vicodin or Tylox). If the pain is not too bad, you may take either Tylenol (acetaminophen) or Advil (ibuprofen) which contain no narcotic agents, and might be tolerated a little better, with fewer side effects. If the pain medication you are sent home with does not control the pain, you will have to let us know. Some narcotic pain medications cannot be given or refilled by a phone call to a pharmacy.  Problems you should report to [place]:  Fever of 101.0 degrees Fahrenheit or greater. Moderate or severe swelling under the skin incision or involving the  scrotum. Drug reaction such as hives, a rash, nausea or vomiting.        No acetaminophen/Tylenol until after 12:15 pm today if needed.     Post Anesthesia Home Care Instructions  Activity: Get plenty of rest for the remainder of the day. A responsible individual must stay with you for 24 hours following the procedure.  For the next 24 hours, DO NOT: -Drive a car -Paediatric nurse -Drink alcoholic beverages -Take any medication unless instructed by your physician -Make any legal  decisions or sign important papers.  Meals: Start with liquid foods such as gelatin or soup. Progress to regular foods as tolerated. Avoid greasy, spicy, heavy foods. If nausea and/or vomiting occur, drink only clear liquids until the nausea and/or vomiting subsides. Call your physician if vomiting continues.  Special Instructions/Symptoms: Your throat may feel dry or sore from the anesthesia or the breathing tube placed in your throat during surgery. If this causes discomfort, gargle with warm salt water. The discomfort should disappear within 24 hours.
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
## 2022-07-16 NOTE — Anesthesia Procedure Notes (Signed)
Procedure Name: LMA Insertion Date/Time: 07/16/2022 7:50 AM  Performed by: Rogers Blocker, CRNAPre-anesthesia Checklist: Patient identified, Emergency Drugs available, Suction available and Patient being monitored Patient Re-evaluated:Patient Re-evaluated prior to induction Oxygen Delivery Method: Circle System Utilized Preoxygenation: Pre-oxygenation with 100% oxygen Induction Type: IV induction Ventilation: Mask ventilation without difficulty LMA: LMA inserted LMA Size: 5.0 Number of attempts: 1 Placement Confirmation: positive ETCO2 Tube secured with: Tape Dental Injury: Teeth and Oropharynx as per pre-operative assessment

## 2022-07-16 NOTE — Anesthesia Postprocedure Evaluation (Signed)
Anesthesia Post Note  Patient: Loretha Stapler  Procedure(s) Performed: RIGHT HYDROCELECTOMY (Right: Scrotum)     Patient location during evaluation: PACU Anesthesia Type: General Level of consciousness: awake and alert Pain management: pain level controlled Vital Signs Assessment: post-procedure vital signs reviewed and stable Respiratory status: spontaneous breathing, nonlabored ventilation and respiratory function stable Cardiovascular status: blood pressure returned to baseline and stable Postop Assessment: no apparent nausea or vomiting Anesthetic complications: no  No notable events documented.  Last Vitals:  Vitals:   07/16/22 0900 07/16/22 0928  BP: 112/80 112/77  Pulse: 60 (!) 57  Resp: 17 16  Temp:  37.1 C  SpO2: 97% 98%    Last Pain:  Vitals:   07/16/22 0928  TempSrc:   PainSc: 0-No pain                 Oanh Devivo,W. EDMOND

## 2022-07-16 NOTE — Transfer of Care (Signed)
Immediate Anesthesia Transfer of Care Note  Patient: David Melendez  Procedure(s) Performed: RIGHT HYDROCELECTOMY (Right: Scrotum)  Patient Location: PACU  Anesthesia Type:General  Level of Consciousness: drowsy, patient cooperative, and responds to stimulation  Airway & Oxygen Therapy: Patient Spontanous Breathing  Post-op Assessment: Report given to RN and Post -op Vital signs reviewed and stable  Post vital signs: Reviewed and stable  Last Vitals:  Vitals Value Taken Time  BP 122/81 07/16/22 0837  Temp 36.6 C 07/16/22 0837  Pulse 70 07/16/22 0840  Resp 14 07/16/22 0840  SpO2 95 % 07/16/22 0840  Vitals shown include unvalidated device data.  Last Pain:  Vitals:   07/16/22 0607  TempSrc: Oral  PainSc: 0-No pain      Patients Stated Pain Goal: 2 (77/37/36 6815)  Complications: No notable events documented.

## 2022-07-17 ENCOUNTER — Encounter (HOSPITAL_BASED_OUTPATIENT_CLINIC_OR_DEPARTMENT_OTHER): Payer: Self-pay | Admitting: Urology

## 2022-07-17 ENCOUNTER — Other Ambulatory Visit (HOSPITAL_COMMUNITY): Payer: Medicare Other

## 2022-08-27 ENCOUNTER — Telehealth (HOSPITAL_COMMUNITY): Payer: Self-pay | Admitting: *Deleted

## 2022-08-27 NOTE — Telephone Encounter (Signed)
Patient given detailed instructions per Stress Test Requisition Sheet for test on 09/02/2022 at 2:30.Patient Notified to arrive 30 minutes early, and that it is imperative to arrive on time for appointment to keep from having the test rescheduled.  Patient verbalized understanding. David Melendez

## 2022-09-02 ENCOUNTER — Ambulatory Visit (HOSPITAL_COMMUNITY): Payer: Medicare Other

## 2022-09-02 ENCOUNTER — Ambulatory Visit (HOSPITAL_COMMUNITY): Payer: Medicare Other | Attending: Cardiology

## 2022-09-02 DIAGNOSIS — R0609 Other forms of dyspnea: Secondary | ICD-10-CM

## 2022-09-02 MED ORDER — PERFLUTREN LIPID MICROSPHERE
1.0000 mL | INTRAVENOUS | Status: AC | PRN
  Administered 2022-09-02 (×3): 2 mL via INTRAVENOUS
  Administered 2022-09-02: 1 mL via INTRAVENOUS
  Administered 2022-09-02: 2 mL via INTRAVENOUS

## 2022-11-19 ENCOUNTER — Telehealth: Payer: Self-pay | Admitting: Medical-Surgical

## 2022-11-19 NOTE — Telephone Encounter (Signed)
Called patient to schedule Medicare Annual Wellness Visit (AWV). Left message for patient to call back and schedule Medicare Annual Wellness Visit (AWV).  Last date of AWV: 05/05/2022  Please schedule an appointment at any time with [facility].  If any questions, please contact me at [phone].  Thank you ,  Lin Givens Patient Access Advocate II Direct Dial: [phone]

## 2023-01-10 ENCOUNTER — Other Ambulatory Visit: Payer: Self-pay | Admitting: Medical-Surgical

## 2023-01-20 ENCOUNTER — Encounter: Payer: Self-pay | Admitting: Medical-Surgical

## 2023-01-20 ENCOUNTER — Ambulatory Visit (INDEPENDENT_AMBULATORY_CARE_PROVIDER_SITE_OTHER): Payer: Medicare Other | Admitting: Medical-Surgical

## 2023-01-20 VITALS — BP 130/71 | HR 63 | Resp 20 | Ht 71.0 in | Wt 230.0 lb

## 2023-01-20 DIAGNOSIS — E559 Vitamin D deficiency, unspecified: Secondary | ICD-10-CM

## 2023-01-20 DIAGNOSIS — J4520 Mild intermittent asthma, uncomplicated: Secondary | ICD-10-CM | POA: Diagnosis not present

## 2023-01-20 DIAGNOSIS — Z125 Encounter for screening for malignant neoplasm of prostate: Secondary | ICD-10-CM

## 2023-01-20 DIAGNOSIS — I493 Ventricular premature depolarization: Secondary | ICD-10-CM

## 2023-01-20 DIAGNOSIS — E782 Mixed hyperlipidemia: Secondary | ICD-10-CM | POA: Diagnosis not present

## 2023-01-20 DIAGNOSIS — M109 Gout, unspecified: Secondary | ICD-10-CM | POA: Diagnosis not present

## 2023-01-20 DIAGNOSIS — R3912 Poor urinary stream: Secondary | ICD-10-CM

## 2023-01-20 MED ORDER — MONTELUKAST SODIUM 10 MG PO TABS: 10.0000 mg | ORAL_TABLET | Freq: Every day | ORAL | 3 refills | Status: DC

## 2023-01-20 MED ORDER — ALLOPURINOL 300 MG PO TABS: 300.0000 mg | ORAL_TABLET | Freq: Every day | ORAL | 3 refills | Status: DC

## 2023-01-20 MED ORDER — CETIRIZINE HCL 10 MG PO TABS: 10.0000 mg | ORAL_TABLET | Freq: Every day | ORAL | 3 refills | Status: DC

## 2023-01-20 MED ORDER — ALBUTEROL SULFATE HFA 108 (90 BASE) MCG/ACT IN AERS: INHALATION_SPRAY | RESPIRATORY_TRACT | 11 refills | Status: DC

## 2023-01-20 NOTE — Progress Notes (Signed)
        Established patient visit  History, exam, impression, and plan:  1. Gout, unspecified cause, unspecified chronicity, unspecified site David Melendez 67 year old male presenting with a history of gout.  Currently taking allopurinol 300 mg daily, tolerating well without side effects.  Does not remember the last time he had a gout flare and feels the medication is working well for him.  Checking uric acid today.  Continue allopurinol as prescribed. - Uric acid  2. Vitamin D deficiency He does have a history of vitamin D deficiency.  Not currently taking a multivitamin or vitamin D supplement.  Checking vitamin D levels today. - VITAMIN D 25 Hydroxy (Vit-D Deficiency, Fractures)  3. HYPERLIPIDEMIA History of hyperlipidemia currently treated with Crestor 20 mg daily.  Notes that he does have some a.m. stiffness in his joints that resolves after about 30 minutes but recurs if he sits down for prolonged periods of time.  Tolerating the medication otherwise with no other significant side effects.  Continues to exercise regularly and follows a low-fat heart healthy diet.  Checking labs today.  Discussed possible etiologies for stiffness.  Potential medication effect however this does sound a lot like osteoarthritis.  Discussed possibly switching over to Lipitor to see if this is better tolerated.  Plan to see lab results and then he will decide. - COMPLETE METABOLIC PANEL WITH GFR - Lipid panel  4. Mild intermittent asthma without complication History of mild intermittent asthma which seems to be very well-controlled.  He is using albuterol inhaler as needed but reports very seldom use.  Refilling albuterol today.  Continue Singulair 10 mg daily. 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   Currently denies any concerning symptoms.  Cardiopulmonary exam benign. - CBC with Differential/Platelet  6. Prostate cancer screening 7. Weak urine stream Reviewed recommendations for prostate cancer screening and the benefit of checking a PSA.  He does note some occasional dribbling after voiding as well as weakened stream at times.  No burning, frequency, urgency, hesitancy, or incomplete emptying.  Checking PSA today. - PSA  Procedures performed this visit: None.  Return in about 1 year (around 01/20/2024) for chronic disease follow up.  __________________________________ Thayer Ohm, DNP, APRN, FNP-BC Primary Care and Sports Medicine [facility] [place]

## 2023-01-21 LAB — CBC WITH DIFFERENTIAL/PLATELET
Absolute Monocytes: 611 cells/uL (ref 200–950)
Basophils Absolute: 28 cells/uL (ref 0–200)
Basophils Relative: 0.5 %
Eosinophils Absolute: 110 cells/uL (ref 15–500)
Eosinophils Relative: 2 %
HCT: 48.5 % (ref 38.5–50.0)
Hemoglobin: 16.4 g/dL (ref 13.2–17.1)
Lymphs Abs: 1788 cells/uL (ref 850–3900)
MCH: 32.8 pg (ref 27.0–33.0)
MCHC: 33.8 g/dL (ref 32.0–36.0)
MCV: 97 fL (ref 80.0–100.0)
MPV: 9.5 fL (ref 7.5–12.5)
Monocytes Relative: 11.1 %
Neutro Abs: 2965 cells/uL (ref 1500–7800)
Neutrophils Relative %: 53.9 %
Platelets: 229 10*3/uL (ref 140–400)
RBC: 5 10*6/uL (ref 4.20–5.80)
RDW: 12.9 % (ref 11.0–15.0)
Total Lymphocyte: 32.5 %
WBC: 5.5 10*3/uL (ref 3.8–10.8)

## 2023-01-21 LAB — COMPLETE METABOLIC PANEL WITH GFR
AG Ratio: 1.9 (calc) (ref 1.0–2.5)
ALT: 23 U/L (ref 9–46)
AST: 20 U/L (ref 10–35)
Albumin: 4.3 g/dL (ref 3.6–5.1)
Alkaline phosphatase (APISO): 54 U/L (ref 35–144)
BUN: 13 mg/dL (ref 7–25)
CO2: 30 mmol/L (ref 20–32)
Calcium: 9.8 mg/dL (ref 8.6–10.3)
Chloride: 103 mmol/L (ref 98–110)
Creat: 0.92 mg/dL (ref 0.70–1.35)
Globulin: 2.3 g/dL (calc) (ref 1.9–3.7)
Glucose, Bld: 94 mg/dL (ref 65–99)
Potassium: 4.6 mmol/L (ref 3.5–5.3)
Sodium: 140 mmol/L (ref 135–146)
Total Bilirubin: 0.8 mg/dL (ref 0.2–1.2)
Total Protein: 6.6 g/dL (ref 6.1–8.1)
eGFR: 92 mL/min/{1.73_m2} (ref >=60)

## 2023-01-21 LAB — VITAMIN D 25 HYDROXY (VIT D DEFICIENCY, FRACTURES): Vit D, 25-Hydroxy: 36 ng/mL (ref 30–100)

## 2023-01-21 LAB — URIC ACID: Uric Acid, Serum: 4.4 mg/dL (ref 4.0–8.0)

## 2023-01-21 LAB — PSA: PSA: 3 ng/mL (ref <=4.00)

## 2023-01-21 LAB — LIPID PANEL
Cholesterol: 169 mg/dL (ref <200)
HDL: 43 mg/dL (ref >=40)
LDL Cholesterol (Calc): 102 mg/dL (calc) — ABNORMAL HIGH
Non-HDL Cholesterol (Calc): 126 mg/dL (calc) (ref <130)
Total CHOL/HDL Ratio: 3.9 (calc) (ref <5.0)
Triglycerides: 142 mg/dL (ref <150)

## 2023-05-11 ENCOUNTER — Ambulatory Visit (INDEPENDENT_AMBULATORY_CARE_PROVIDER_SITE_OTHER): Payer: Medicare Other | Admitting: Medical-Surgical

## 2023-05-11 DIAGNOSIS — Z0000 Encounter for general adult medical examination without abnormal findings: Secondary | ICD-10-CM | POA: Diagnosis not present

## 2023-05-11 DIAGNOSIS — Z1211 Encounter for screening for malignant neoplasm of colon: Secondary | ICD-10-CM
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
## 2023-05-11 NOTE — Patient Instructions (Signed)
MEDICARE ANNUAL WELLNESS VISIT Health Maintenance Summary and Written Plan of Care  [name] ,  Thank you for allowing me to perform your Medicare Annual Wellness Visit and for your ongoing commitment to your health.   Health Maintenance & Immunization History Health Maintenance  Topic Date Due   Colonoscopy  08/08/2023   COVID-19 Vaccine (3 - 2023-24 season) 05/27/2023 (Originally 04/18/2023)   INFLUENZA VACCINE  11/15/2023 (Originally 03/18/2023)   Pneumonia Vaccine 45+ Years old (2 of 2 - PPSV23 or PCV20) 05/10/2024 (Originally 05/06/2023)   DTaP/Tdap/Td (3 - Td or Tdap) 12/29/2023   Medicare Annual Wellness (AWV)  05/10/2024   Hepatitis C Screening  Completed   Zoster Vaccines- Shingrix  Completed   HPV VACCINES  Aged Out   Immunization History  Administered Date(s) Administered   Influenza,inj,Quad PF,6+ Mos 05/31/2018   Moderna Sars-Covid-2 Vaccination 04/26/2020, 05/24/2020   Pneumococcal Conjugate-13 05/05/2022   Tdap 02/06/2010, 12/28/2013   Zoster Recombinant(Shingrix) 05/18/2017, 07/19/2017    These are the patient goals that we discussed:  Goals Addressed               This Visit's Progress     Patient Stated (pt-stated)        Patient stated that he would like to loose weight.         This is a list of Health Maintenance Items that are overdue or due now: Health Maintenance Due  Topic Date Due   Colonoscopy  08/08/2023     Orders/Referrals Placed Today: Orders Placed This Encounter  Procedures   Ambulatory referral to Gastroenterology (for Colonoscopy)    Referral Priority:   Routine    Referral Type:   Consultation    Referral Reason:   Specialty Services Required    Number of Visits Requested:   1   (Contact our referral department at [phone] if you have not spoken with someone about your referral appointment within the next 5 days)    Follow-up Plan Follow-up with Christen Butter, NP as planned Discuss pneumonia vaccine with PCP.   Medicare wellness visit in one year.  Patient will access AVS on my chart.      Health Maintenance, Male Adopting a healthy lifestyle and getting preventive care are important in promoting health and wellness. Ask your health care provider about: The right schedule for you to have regular tests and exams. Things you can do on your own to prevent diseases and keep yourself healthy. What should I know about diet, weight, and exercise? Eat a healthy diet  Eat a diet that includes plenty of vegetables, fruits, low-fat dairy products, and lean protein. Do not eat a lot of foods that are high in solid fats, added sugars, or sodium. Maintain a healthy weight Body mass index (BMI) is a measurement that can be used to identify possible weight problems. It estimates body fat based on height and weight. Your health care provider can help determine your BMI and help you achieve or maintain a healthy weight. Get regular exercise Get regular exercise. This is one of the most important things you can do for your health. Most adults should: Exercise for at least 150 minutes each week. The exercise should increase your heart rate and make you sweat (moderate-intensity exercise). Do strengthening exercises at least twice a week. This is in addition to the moderate-intensity exercise. Spend less time sitting. Even light physical activity can be beneficial. Watch cholesterol and blood lipids Have your blood tested for lipids and cholesterol at  66 years of age, then have this test every 5 years. You may need to have your cholesterol levels checked more often if: Your lipid or cholesterol levels are high. You are older than 66 years of age. You are at high risk for heart disease. What should I know about cancer screening? Many types of cancers can be detected early and may often be prevented. Depending on your health history and family history, you may need to have cancer screening at various ages. This may  include screening for: Colorectal cancer. Prostate cancer. Skin cancer. Lung cancer. What should I know about heart disease, diabetes, and high blood pressure? Blood pressure and heart disease High blood pressure causes heart disease and increases the risk of stroke. This is more likely to develop in people who have high blood pressure readings or are overweight. Talk with your health care provider about your target blood pressure readings. Have your blood pressure checked: Every 3-5 years if you are 28-47 years of age. Every year if you are 66 years old or older. If you are between the ages of 19 and 44 and are a current or former smoker, ask your health care provider if you should have a one-time screening for abdominal aortic aneurysm (AAA). Diabetes Have regular diabetes screenings. This checks your fasting blood sugar level. Have the screening done: Once every three years after age 70 if you are at a normal weight and have a low risk for diabetes. More often and at a younger age if you are overweight or have a high risk for diabetes. What should I know about preventing infection? Hepatitis B If you have a higher risk for hepatitis B, you should be screened for this virus. Talk with your health care provider to find out if you are at risk for hepatitis B infection. Hepatitis C Blood testing is recommended for: Everyone born from [birth year] through 1964. Anyone with known risk factors for hepatitis C. Sexually transmitted infections (STIs) You should be screened each year for STIs, including gonorrhea and chlamydia, if: You are sexually active and are younger than 66 years of age. You are older than 66 years of age and your health care provider tells you that you are at risk for this type of infection. Your sexual activity has changed since you were last screened, and you are at increased risk for chlamydia or gonorrhea. Ask your health care provider if you are at risk. Ask your health care  provider about whether you are at high risk for HIV. Your health care provider may recommend a prescription medicine to help prevent HIV infection. If you choose to take medicine to prevent HIV, you should first get tested for HIV. You should then be tested every 3 months for as long as you are taking the medicine. Follow these instructions at home: Alcohol use Do not drink alcohol if your health care provider tells you not to drink. If you drink alcohol: Limit how much you have to 0-2 drinks a day. Know how much alcohol is in your drink. In the [place] one drink equals one 12 oz bottle of beer (355 mL), one 5 oz glass of wine (148 mL), or one 1 oz glass of hard liquor (44 mL). Lifestyle Do not use any products that contain nicotine or tobacco. These products include cigarettes, chewing tobacco, and vaping devices, such as e-cigarettes. If you need help quitting, ask your health care provider. Do not use street drugs. Do not share needles. Ask your health  care provider for help if you need support or information about quitting drugs. General instructions Schedule regular health, dental, and eye exams. Stay current with your vaccines. Tell your health care provider if: You often feel depressed. You have ever been abused or do not feel safe at home. Summary Adopting a healthy lifestyle and getting preventive care are important in promoting health and wellness. Follow your health care provider's instructions about healthy diet, exercising, and getting tested or screened for diseases. Follow your health care provider's instructions on monitoring your cholesterol and blood pressure. This information is not intended to replace advice given to you by your health care provider. Make sure you discuss any questions you have with your health care provider. Document Revised: 12/23/2020 Document Reviewed: 12/23/2020 Elsevier Patient Education  2024 [employer].

## 2023-05-17 ENCOUNTER — Ambulatory Visit
Admission: RE | Admit: 2023-05-17 | Discharge: 2023-05-17 | Disposition: A | Discharge status: Home / Self Care | Payer: Medicare Other | Source: Ambulatory Visit | Attending: Family Medicine | Admitting: Family Medicine

## 2023-05-17 VITALS — BP 153/92 | HR 88 | Temp 98.5°F | Resp 17

## 2023-05-17 DIAGNOSIS — R059 Cough, unspecified: Secondary | ICD-10-CM | POA: Diagnosis not present

## 2023-05-17 DIAGNOSIS — J40 Bronchitis, not specified as acute or chronic: Secondary | ICD-10-CM

## 2023-05-17 DIAGNOSIS — J0100 Acute maxillary sinusitis, unspecified: Secondary | ICD-10-CM | POA: Diagnosis not present

## 2023-05-17 MED ORDER — BENZONATATE 200 MG PO CAPS: 200.0000 mg | ORAL_CAPSULE | Freq: Three times a day (TID) | ORAL | 0 refills | Status: AC | PRN

## 2023-05-17 MED ORDER — PREDNISONE 10 MG (21) PO TBPK: ORAL_TABLET | Freq: Every day | ORAL | 0 refills | Status: DC

## 2023-05-17 MED ORDER — AMOXICILLIN-POT CLAVULANATE 875-125 MG PO TABS: 1.0000 | ORAL_TABLET | Freq: Two times a day (BID) | ORAL | 0 refills | Status: AC

## 2023-05-17 NOTE — ED Triage Notes (Signed)
Pt c/o sinus congestion, cough and wheezing x 3 days. Denies fever. Taking guafenisin prn.

## 2023-05-17 NOTE — Discharge Instructions (Addendum)
Advised patient to take medication as directed with food to completion.  Advised patient to take prednisone with first dose of Augmentin for the next 10 days.  Advised may use Tessalon capsules for cough daily or as needed.  Encouraged increase daily water intake to 64 ounces per day while taking these medications.  Advised if symptoms worsen and/or unresolved please follow-up with PCP or here for further evaluation.
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
## 2023-05-29 ENCOUNTER — Ambulatory Visit
Admission: EM | Admit: 2023-05-29 | Discharge: 2023-05-29 | Disposition: A | Discharge status: Home / Self Care | Payer: Medicare Other | Attending: Family Medicine | Admitting: Family Medicine

## 2023-05-29 ENCOUNTER — Other Ambulatory Visit: Payer: Self-pay

## 2023-05-29 DIAGNOSIS — J4521 Mild intermittent asthma with (acute) exacerbation: Secondary | ICD-10-CM | POA: Diagnosis not present

## 2023-05-29 DIAGNOSIS — J209 Acute bronchitis, unspecified: Secondary | ICD-10-CM | POA: Diagnosis not present

## 2023-05-29 MED ORDER — DOXYCYCLINE HYCLATE 100 MG PO CAPS: 100.0000 mg | ORAL_CAPSULE | Freq: Two times a day (BID) | ORAL | 0 refills | Status: DC
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
## 2023-05-29 NOTE — ED Triage Notes (Signed)
Here on 9/30 for same; congestion, cough, wheezing. Was on round of abx which has completed them and still not feeling better. No fever.

## 2023-05-29 NOTE — Discharge Instructions (Signed)
Take the doxycycline antibiotic 2 times a day.  It is important to take this medicine with food. Make sure you are drinking lots of water Run a humidifier in your bedroom if you are able Use inhaler as needed for wheezing See your doctor if not improving in a week

## 2023-06-20 ENCOUNTER — Other Ambulatory Visit: Payer: Self-pay | Admitting: Cardiology

## 2023-06-20 DIAGNOSIS — E782 Mixed hyperlipidemia: Secondary | ICD-10-CM

## 2023-06-20 DIAGNOSIS — R931 Abnormal findings on diagnostic imaging of heart and coronary circulation: Secondary | ICD-10-CM

## 2023-06-25 ENCOUNTER — Ambulatory Visit
Admission: RE | Admit: 2023-06-25 | Discharge: 2023-06-25 | Disposition: A | Discharge status: Home / Self Care | Payer: Medicare Other | Source: Ambulatory Visit | Attending: Physician Assistant | Admitting: Physician Assistant

## 2023-06-25 ENCOUNTER — Other Ambulatory Visit: Payer: Self-pay

## 2023-06-25 ENCOUNTER — Ambulatory Visit: Payer: Medicare Other

## 2023-06-25 VITALS — BP 152/95 | HR 73 | Temp 98.6°F | Resp 16

## 2023-06-25 DIAGNOSIS — J322 Chronic ethmoidal sinusitis: Secondary | ICD-10-CM | POA: Diagnosis not present

## 2023-06-25 DIAGNOSIS — R059 Cough, unspecified: Secondary | ICD-10-CM

## 2023-06-25 DIAGNOSIS — R051 Acute cough: Secondary | ICD-10-CM

## 2023-06-25 MED ORDER — IPRATROPIUM-ALBUTEROL 0.5-2.5 (3) MG/3ML IN SOLN
3.0000 mL | Freq: Once | RESPIRATORY_TRACT | Status: AC
  Administered 2023-06-25: 3 mL via RESPIRATORY_TRACT

## 2023-06-25 MED ORDER — PREDNISONE 50 MG PO TABS: ORAL_TABLET | ORAL | 0 refills | Status: DC

## 2023-06-25 MED ORDER — AZITHROMYCIN 250 MG PO TABS: ORAL_TABLET | ORAL | 0 refills | Status: DC

## 2023-06-25 NOTE — ED Triage Notes (Addendum)
Cough, wheezing, congestion has been sick since September, has been on abx twice. Sts he is wondering if a breathing tx would help.

## 2023-06-25 NOTE — Discharge Instructions (Addendum)
See your provider for recheck next week.
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
## 2023-06-28 ENCOUNTER — Encounter: Payer: Self-pay | Admitting: Medical-Surgical

## 2023-06-29 ENCOUNTER — Encounter: Payer: Self-pay | Admitting: Medical-Surgical

## 2023-06-29 ENCOUNTER — Ambulatory Visit (INDEPENDENT_AMBULATORY_CARE_PROVIDER_SITE_OTHER): Payer: Medicare Other | Admitting: Medical-Surgical

## 2023-06-29 ENCOUNTER — Ambulatory Visit: Payer: Medicare Other

## 2023-06-29 VITALS — BP 164/90 | HR 69 | Resp 20 | Ht 71.0 in | Wt 235.0 lb

## 2023-06-29 DIAGNOSIS — R0609 Other forms of dyspnea: Secondary | ICD-10-CM | POA: Diagnosis not present

## 2023-06-29 DIAGNOSIS — R052 Subacute cough: Secondary | ICD-10-CM

## 2023-06-29 DIAGNOSIS — R918 Other nonspecific abnormal finding of lung field: Secondary | ICD-10-CM | POA: Diagnosis not present

## 2023-06-29 DIAGNOSIS — I2510 Atherosclerotic heart disease of native coronary artery without angina pectoris: Secondary | ICD-10-CM | POA: Diagnosis not present

## 2023-06-29 MED ORDER — FULL KIT NEBULIZER SET MISC: 0 refills | Status: DC

## 2023-06-29 MED ORDER — IPRATROPIUM-ALBUTEROL 0.5-2.5 (3) MG/3ML IN SOLN: RESPIRATORY_TRACT | 3 refills | Status: DC

## 2023-06-29 NOTE — Addendum Note (Signed)
Addended byChristen Butter on: 06/29/2023 04:43 PM   Modules accepted: Orders

## 2023-06-29 NOTE — Progress Notes (Signed)
Established patient visit  History, exam, impression, and plan:  1. Subacute cough 2. DOE (dyspnea on exertion) Pleasant 67 year old male presenting today for reevaluation of persistent cough and dyspnea on exertion.  Symptoms have persisted for 6 to 8 weeks and he has been evaluated at urgent care 3 times.  Initially started with sinus congestion and wheezing.  Went to urgent care on 05/17/2023 for further evaluation.  Although documented that he was coughing, notes that this was a misunderstanding and he was not coughing at the time.  He was treated with a steroid taper and Augmentin.  He completed the treatment however his symptoms did not improve.  He returned to urgent care on 1012 and was switched to a week of doxycycline.  He completed this but also continued to have significant issues with cough, chest congestion, sputum production, and wheezing.  On 06/25/2023, he returned to urgent care with continued shortness of breath, coughing, wheezing, and sputum production.  He was given a DuoNeb treatment in office which provided some relief of symptoms then discharged with prednisone and azithromycin.  He has been compliant with his therapies but today reports that the dyspnea on exertion, productive cough, and wheezing have continued.  He does have an albuterol inhaler but reports that he does not use it much.  Compliant with Singulair dosing.  Has been taking mucus relief (guaifenesin) which seems to be the only thing that helped a bit.  Endorses increased lethargy since this all started.  A chest x-ray was completed on 06/25/2023 showing negative for cardiopulmonary disease.  At that time, he had an EKG completed showing normal sinus rhythm with PVCs.  He was recommended to schedule a follow-up with his cardiologist to discuss these findings and the new onset of dyspnea.  He did a COVID test at the very start of his symptoms which was negative but that was never repeated adding any of his urgent  care visits.  He was never tested for flu or other respiratory viruses.  See below for physical exam today.  Unclear etiology.  Given the significance of his dyspnea on exertion and continued wheezing/rhonchi/rales, plan for chest CT without contrast for further evaluation.  Prescription for a nebulizer machine as well as DuoNebs provided with plan to complete DuoNeb scheduled every 6 hours for 2 days, then every 8 hours for 2 days then every 12 hours for 2 days then every 6 hours as needed.  Do not use DuoNebs in conjunction with his handheld albuterol inhaler.  Recommend deep breathing exercises.  Okay to continue the guaifenesin if this is helpful. - CT Chest Wo Contrast; Future  Physical Exam Vitals and nursing note reviewed.  Constitutional:      General: He is not in acute distress.    Appearance: Normal appearance.  HENT:     Head: Normocephalic and atraumatic.  Cardiovascular:     Rate and Rhythm: Normal rate and regular rhythm.     Pulses: Normal pulses.     Heart sounds: Normal heart sounds. No murmur heard.    No friction rub. No gallop.  Pulmonary:     Effort: Pulmonary effort is normal. No respiratory distress.     Breath sounds: Wheezing, rhonchi and rales present.  Skin:    General: Skin is warm and dry.  Neurological:     Mental Status: He is alert and oriented to person, place, and time.  Psychiatric:        Mood and  Affect: Mood normal.        Behavior: Behavior normal.        Thought Content: Thought content normal.        Judgment: Judgment normal.    Procedures performed this visit: None.  Return if symptoms worsen or fail to improve.  Further follow-up pending imaging results.  __________________________________ Thayer Ohm, DNP, APRN, FNP-BC Primary Care and Sports Medicine [facility]

## 2023-06-30 MED ORDER — PREDNISONE 10 MG (48) PO TBPK: ORAL_TABLET | ORAL | 0 refills | Status: DC

## 2023-06-30 NOTE — Telephone Encounter (Signed)
I called [facility]iology and changed to stat.

## 2023-07-17 ENCOUNTER — Other Ambulatory Visit: Payer: Self-pay | Admitting: Cardiology

## 2023-07-17 DIAGNOSIS — E782 Mixed hyperlipidemia: Secondary | ICD-10-CM

## 2023-07-17 DIAGNOSIS — R931 Abnormal findings on diagnostic imaging of heart and coronary circulation: Secondary | ICD-10-CM

## 2023-07-21 ENCOUNTER — Ambulatory Visit: Payer: Medicare Other

## 2023-07-21 ENCOUNTER — Encounter: Payer: Self-pay | Admitting: Cardiology

## 2023-07-21 ENCOUNTER — Ambulatory Visit: Payer: Medicare Other | Attending: Cardiology | Admitting: Cardiology

## 2023-07-21 VITALS — BP 132/82 | HR 64 | Ht 70.6 in | Wt 235.1 lb

## 2023-07-21 DIAGNOSIS — R002 Palpitations: Secondary | ICD-10-CM | POA: Diagnosis present

## 2023-07-21 DIAGNOSIS — R931 Abnormal findings on diagnostic imaging of heart and coronary circulation: Secondary | ICD-10-CM

## 2023-07-21 DIAGNOSIS — I493 Ventricular premature depolarization: Secondary | ICD-10-CM

## 2023-07-21 DIAGNOSIS — E782 Mixed hyperlipidemia: Secondary | ICD-10-CM | POA: Diagnosis present

## 2023-07-21 DIAGNOSIS — E66811 Obesity, class 1: Secondary | ICD-10-CM
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
## 2023-07-21 NOTE — Patient Instructions (Signed)
Medication Instructions:  Your physician recommends that you continue on your current medications as directed. Please refer to the Current Medication list given to you today.  *If you need a refill on your cardiac medications before your next appointment, please call your pharmacy*   Lab Work: Your physician recommends that you return for lab work in: the next few days for CMP and lipids. You need to have labs done when you are fasting. [facility] lab is located on the 3rd floor, [address]. Hours are Monday - Friday 8 am to 4 pm, closed 11:30 am to 1:00 pm. You do NOT need an appointment.   If you have labs (blood work) drawn today and your tests are completely normal, you will receive your results only by: MyChart Message (if you have MyChart) OR A paper copy in the mail If you have any lab test that is abnormal or we need to change your treatment, we will call you to review the results.   Testing/Procedures: A zio monitor was ordered today. It will remain on for 14 days. Remove 08/04/23. You will then return monitor and event diary in provided box. It takes 1-2 weeks for report to be downloaded and returned to [place]. We will call you with the results. If monitor falls off or has orange flashing light, please call Zio for further instructions.    Follow-Up: At [facility], you and your health needs are our priority.  As part of our continuing mission to provide you with exceptional heart care, we have created designated Provider Care Teams.  These Care Teams include your primary Cardiologist (physician) and Advanced Practice Providers (APPs -  Physician Assistants and Nurse Practitioners) who all work together to provide you with the care you need, when you need it.  We recommend signing up for the patient portal called "MyChart".  Sign up information is provided on this After Visit Summary.  MyChart is used to connect with patients for Virtual Visits (Telemedicine).  Patients are able  to view lab/test results, encounter notes, upcoming appointments, etc.  Non-urgent messages can be sent to your provider as well.   To learn more about what you can do with MyChart, go to [email or web address].    Your next appointment:   12 month(s)  The format for your next appointment:   In Person  Provider:   Belva Crome, MD    Other Instructions none  Important Information About Sugar

## 2023-07-24 LAB — COMPREHENSIVE METABOLIC PANEL
ALT: 42 [IU]/L (ref 0–44)
AST: 30 [IU]/L (ref 0–40)
Albumin: 4.5 g/dL (ref 3.9–4.9)
Alkaline Phosphatase: 73 [IU]/L (ref 44–121)
BUN/Creatinine Ratio: 15 (ref 10–24)
BUN: 15 mg/dL (ref 8–27)
Bilirubin Total: 0.6 mg/dL (ref 0.0–1.2)
CO2: 24 mmol/L (ref 20–29)
Calcium: 10 mg/dL (ref 8.6–10.2)
Chloride: 105 mmol/L (ref 96–106)
Creatinine, Ser: 1.02 mg/dL (ref 0.76–1.27)
Globulin, Total: 2.2 g/dL (ref 1.5–4.5)
Glucose: 114 mg/dL — ABNORMAL HIGH (ref 70–99)
Potassium: 4.9 mmol/L (ref 3.5–5.2)
Sodium: 149 mmol/L — ABNORMAL HIGH (ref 134–144)
Total Protein: 6.7 g/dL (ref 6.0–8.5)
eGFR: 81 mL/min/{1.73_m2} (ref >59)

## 2023-07-24 LAB — LIPID PANEL
Chol/HDL Ratio: 5.8 {ratio} — ABNORMAL HIGH (ref 0.0–5.0)
Cholesterol, Total: 227 mg/dL — ABNORMAL HIGH (ref 100–199)
HDL: 39 mg/dL — ABNORMAL LOW (ref >39)
LDL Chol Calc (NIH): 148 mg/dL — ABNORMAL HIGH (ref 0–99)
Triglycerides: 217 mg/dL — ABNORMAL HIGH (ref 0–149)
VLDL Cholesterol Cal: 40 mg/dL (ref 5–40)

## 2023-07-26 ENCOUNTER — Telehealth: Payer: Self-pay

## 2023-07-26 DIAGNOSIS — E782 Mixed hyperlipidemia: Secondary | ICD-10-CM

## 2023-07-26 DIAGNOSIS — R931 Abnormal findings on diagnostic imaging of heart and coronary circulation: Secondary | ICD-10-CM

## 2023-07-26 MED ORDER — ROSUVASTATIN CALCIUM 20 MG PO TABS: 40.0000 mg | ORAL_TABLET | Freq: Every day | ORAL | 3 refills | Status: DC

## 2023-07-26 NOTE — Telephone Encounter (Signed)
-----   Message from [place] Revankar sent at 07/26/2023 12:21 PM EST ----- Double statin, diet LL 6wks cc pcp Garwin Brothers, MD 07/26/2023 12:20 PM

## 2023-07-26 NOTE — Telephone Encounter (Signed)
Viewed in MyChart Routed to PCP  

## 2023-08-09 LAB — HM COLONOSCOPY

## 2023-10-04 ENCOUNTER — Other Ambulatory Visit: Payer: Self-pay

## 2023-10-04 ENCOUNTER — Telehealth: Payer: Self-pay | Admitting: Cardiology

## 2023-10-04 DIAGNOSIS — E782 Mixed hyperlipidemia: Secondary | ICD-10-CM

## 2023-10-04 DIAGNOSIS — R931 Abnormal findings on diagnostic imaging of heart and coronary circulation: Secondary | ICD-10-CM

## 2023-10-04 MED ORDER — ROSUVASTATIN CALCIUM 40 MG PO TABS: 40.0000 mg | ORAL_TABLET | Freq: Every day | ORAL | 3 refills | Status: DC

## 2023-10-04 NOTE — Telephone Encounter (Signed)
*  STAT* If patient is at the pharmacy, call can be transferred to refill team.   1. Which medications need to be refilled? (please list name of each medication and dose if known)   rosuvastatin (CRESTOR) 20 MG tablet    2. Which pharmacy/location (including street and city if local pharmacy) is medication to be sent to? [facility] [phone] - [place], [place] - [address] Phone: [phone]  Fax: [phone]     3. Do they need a 30 day or 90 day supply? 90

## 2024-01-20 ENCOUNTER — Encounter: Payer: Self-pay | Admitting: Medical-Surgical

## 2024-01-20 ENCOUNTER — Ambulatory Visit (INDEPENDENT_AMBULATORY_CARE_PROVIDER_SITE_OTHER): Payer: Medicare Other | Admitting: Medical-Surgical

## 2024-01-20 VITALS — BP 115/68 | HR 62 | Resp 20 | Ht 70.6 in | Wt 228.1 lb

## 2024-01-20 DIAGNOSIS — J4520 Mild intermittent asthma, uncomplicated: Secondary | ICD-10-CM

## 2024-01-20 DIAGNOSIS — Z125 Encounter for screening for malignant neoplasm of prostate: Secondary | ICD-10-CM | POA: Diagnosis not present

## 2024-01-20 DIAGNOSIS — E559 Vitamin D deficiency, unspecified: Secondary | ICD-10-CM

## 2024-01-20 DIAGNOSIS — E782 Mixed hyperlipidemia: Secondary | ICD-10-CM

## 2024-01-20 DIAGNOSIS — M109 Gout, unspecified: Secondary | ICD-10-CM

## 2024-01-20 DIAGNOSIS — I493 Ventricular premature depolarization: Secondary | ICD-10-CM

## 2024-01-20 DIAGNOSIS — R9439 Abnormal result of other cardiovascular function study: Secondary | ICD-10-CM

## 2024-01-20 MED ORDER — ATORVASTATIN CALCIUM 40 MG PO TABS: 40.0000 mg | ORAL_TABLET | Freq: Every day | ORAL | 3 refills | Status: DC

## 2024-01-20 MED ORDER — MONTELUKAST SODIUM 10 MG PO TABS: 10.0000 mg | ORAL_TABLET | Freq: Every day | ORAL | 3 refills | Status: AC

## 2024-01-20 MED ORDER — ALBUTEROL SULFATE HFA 108 (90 BASE) MCG/ACT IN AERS: INHALATION_SPRAY | RESPIRATORY_TRACT | 11 refills | Status: AC

## 2024-01-20 MED ORDER — ALLOPURINOL 300 MG PO TABS: 300.0000 mg | ORAL_TABLET | Freq: Every day | ORAL | 3 refills | Status: AC

## 2024-01-20 MED ORDER — CETIRIZINE HCL 10 MG PO TABS: 10.0000 mg | ORAL_TABLET | Freq: Every day | ORAL | 3 refills | Status: AC

## 2024-01-20 NOTE — Progress Notes (Signed)
        Established patient visit  History, exam, impression, and plan:  1. Mild intermittent asthma without complication Very pleasant 68 year old male presenting today with a history of mild intermittent asthma.  He is currently taking Singulair  10 mg nightly.  Also using Zyrtec  10 mg daily as needed but usually takes this 1 in the fall in the spring during peak allergy season.  Tolerates both medications well without side effects.  Has an albuterol  inhaler that he uses for rescue but admits that he only has to use this about twice a year.  Overall doing well and has no current respiratory complaints.  Lungs CTA with even and unlabored respirations.  HRRR, S1/S2 normal.  POX 98% on room air.  Continue Singulair  and Zyrtec  as prescribed.  Refilling medications and albuterol  inhaler today. - albuterol  (PROAIR  HFA) 108 (90 Base) MCG/ACT inhaler; TAKE 1 TO 2 PUFFS EVERY 4 HOURS AS NEEDED  Dispense: 2 each; Refill: 11 - cetirizine  (ZYRTEC ) 10 MG tablet; Take 1 tablet (10 mg total) by mouth daily.  Dispense: 90 tablet; Refill: 3 - montelukast  (SINGULAIR ) 10 MG tablet; Take 1 tablet (10 mg total) by mouth daily.  Dispense: 90 tablet; Refill: 3 - CBC  2. Prostate cancer screening (Primary) Checking PSA today. - PSA Total (Reflex To Free)  3. Vitamin D  deficiency Checking vitamin D . - VITAMIN D  25 Hydroxy (Vit-D Deficiency, Fractures)  4. HYPERLIPIDEMIA History of hyperlipidemia currently on Crestor  40 mg daily.  This was increased in December by cardiology from 20 mg to 40.  Now he notes increase in body aches and joint pains.  He is interested in going back to the 20 mg dose or switching to something different.  Has not tried Lipitor before and would like to switch over.  Working on a low fat heart healthy diet and exercises regularly.  Checking lipids today.  We are switching him from Crestor  40 mg to Lipitor 40 mg with plan to recheck in 6-8 weeks to evaluate tolerance and response to the  medication. - CMP14+EGFR - Lipid panel  5. Gout, unspecified cause, unspecified chronicity, unspecified site History of gout currently treated with allopurinol  300 mg daily.  No recent gout attacks and is doing well overall.  Checking CMP and uric acid today. - allopurinol  (ZYLOPRIM ) 300 MG tablet; Take 1 tablet (300 mg total) by mouth daily.  Dispense: 90 tablet; Refill: 3 - CMP14+EGFR - Uric acid  6. Frequent PVCs 7. Abnormal stress test He is currently followed by cardiology but notes that he would like to have a different provider.  Referring to cardiology for change in provider to a [facility] location per patient request. - Ambulatory referral to Cardiology  Procedures performed this visit: None.  Return in about 1 year (around 01/19/2025) for chronic disease follow up.  __________________________________ Maryl Snook, DNP, APRN, FNP-BC Primary Care and Sports Medicine [facility] [place]

## 2024-01-21 LAB — LIPID PANEL

## 2024-01-25 ENCOUNTER — Ambulatory Visit: Payer: Self-pay | Admitting: Medical-Surgical

## 2024-01-26 LAB — CMP14+EGFR
ALT: 18 IU/L (ref 0–44)
AST: 21 IU/L (ref 0–40)
Albumin: 4.4 g/dL (ref 3.9–4.9)
Alkaline Phosphatase: 96 IU/L (ref 44–121)
BUN/Creatinine Ratio: 15 (ref 10–24)
BUN: 17 mg/dL (ref 8–27)
Bilirubin Total: 0.3 mg/dL (ref 0.0–1.2)
CO2: 22 mmol/L (ref 20–29)
Calcium: 9.8 mg/dL (ref 8.6–10.2)
Chloride: 103 mmol/L (ref 96–106)
Creatinine, Ser: 1.12 mg/dL (ref 0.76–1.27)
Globulin, Total: 2.2 g/dL (ref 1.5–4.5)
Glucose: 89 mg/dL (ref 70–99)
Potassium: 5 mmol/L (ref 3.5–5.2)
Sodium: 142 mmol/L (ref 134–144)
Total Protein: 6.6 g/dL (ref 6.0–8.5)
eGFR: 72 mL/min/{1.73_m2} (ref >59)

## 2024-01-26 LAB — URIC ACID: Uric Acid: 5.1 mg/dL (ref 3.8–8.4)

## 2024-01-26 LAB — PSA TOTAL (REFLEX TO FREE): Prostate Specific Ag, Serum: 3.8 ng/mL (ref 0.0–4.0)

## 2024-01-26 LAB — CBC
Hematocrit: 48.6 % (ref 37.5–51.0)
Hemoglobin: 16 g/dL (ref 13.0–17.7)
MCH: 32.4 pg (ref 26.6–33.0)
MCHC: 32.9 g/dL (ref 31.5–35.7)
MCV: 98 fL — ABNORMAL HIGH (ref 79–97)
Platelets: 223 10*3/uL (ref 150–450)
RBC: 4.94 x10E6/uL (ref 4.14–5.80)
RDW: 13.7 % (ref 11.6–15.4)
WBC: 5.2 10*3/uL (ref 3.4–10.8)

## 2024-01-26 LAB — LIPID PANEL
Chol/HDL Ratio: 4.1 ratio (ref 0.0–5.0)
Cholesterol, Total: 149 mg/dL (ref 100–199)
HDL: 36 mg/dL — ABNORMAL LOW (ref >39)
LDL Chol Calc (NIH): 92 mg/dL (ref 0–99)
Triglycerides: 112 mg/dL (ref 0–149)
VLDL Cholesterol Cal: 21 mg/dL (ref 5–40)

## 2024-01-26 LAB — VITAMIN D 25 HYDROXY (VIT D DEFICIENCY, FRACTURES): Vit D, 25-Hydroxy: 33.8 ng/mL (ref 30.0–100.0)

## 2024-02-29 ENCOUNTER — Other Ambulatory Visit: Payer: Self-pay

## 2024-02-29 DIAGNOSIS — Z125 Encounter for screening for malignant neoplasm of prostate: Secondary | ICD-10-CM

## 2024-03-01 ENCOUNTER — Ambulatory Visit: Payer: Self-pay | Admitting: Medical-Surgical

## 2024-03-01 LAB — PSA TOTAL (REFLEX TO FREE): Prostate Specific Ag, Serum: 3.6 ng/mL (ref 0.0–4.0)

## 2024-03-02 NOTE — Addendum Note (Signed)
 Addended by: FANNY NIELS CROME on: 03/02/2024 01:27 PM   Modules accepted: Orders

## 2024-03-03 ENCOUNTER — Other Ambulatory Visit: Payer: Self-pay | Admitting: Medical-Surgical

## 2024-03-03 LAB — CMP14+EGFR
ALT: 25 IU/L (ref 0–44)
AST: 21 IU/L (ref 0–40)
Albumin: 4.3 g/dL (ref 3.9–4.9)
Alkaline Phosphatase: 80 IU/L (ref 44–121)
BUN/Creatinine Ratio: 14 (ref 10–24)
BUN: 13 mg/dL (ref 8–27)
Bilirubin Total: 0.6 mg/dL (ref 0.0–1.2)
CO2: 20 mmol/L (ref 20–29)
Calcium: 9.7 mg/dL (ref 8.6–10.2)
Chloride: 101 mmol/L (ref 96–106)
Creatinine, Ser: 0.9 mg/dL (ref 0.76–1.27)
Globulin, Total: 2.1 g/dL (ref 1.5–4.5)
Glucose: 97 mg/dL (ref 70–99)
Potassium: 4.3 mmol/L (ref 3.5–5.2)
Sodium: 141 mmol/L (ref 134–144)
Total Protein: 6.4 g/dL (ref 6.0–8.5)
eGFR: 94 mL/min/1.73 (ref >59)

## 2024-03-03 LAB — SPECIMEN STATUS REPORT

## 2024-03-03 LAB — LIPID PANEL
Chol/HDL Ratio: 5.8 ratio — ABNORMAL HIGH (ref 0.0–5.0)
Cholesterol, Total: 193 mg/dL (ref 100–199)
HDL: 33 mg/dL — ABNORMAL LOW (ref >39)
LDL Chol Calc (NIH): 131 mg/dL — ABNORMAL HIGH (ref 0–99)
Triglycerides: 163 mg/dL — ABNORMAL HIGH (ref 0–149)
VLDL Cholesterol Cal: 29 mg/dL (ref 5–40)

## 2024-03-03 MED ORDER — ROSUVASTATIN CALCIUM 40 MG PO TABS: 40.0000 mg | ORAL_TABLET | Freq: Every day | ORAL | Status: AC

## 2024-04-18 ENCOUNTER — Encounter: Payer: Self-pay | Admitting: Sports Medicine
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
## 2024-05-08 ENCOUNTER — Ambulatory Visit (INDEPENDENT_AMBULATORY_CARE_PROVIDER_SITE_OTHER): Admitting: Cardiology

## 2024-05-08 ENCOUNTER — Encounter: Payer: Self-pay | Admitting: Cardiology

## 2024-05-08 VITALS — BP 156/92 | HR 75 | Ht 70.0 in | Wt 228.0 lb

## 2024-05-08 DIAGNOSIS — R0989 Other specified symptoms and signs involving the circulatory and respiratory systems: Secondary | ICD-10-CM

## 2024-05-08 DIAGNOSIS — E782 Mixed hyperlipidemia: Secondary | ICD-10-CM | POA: Diagnosis not present

## 2024-05-08 MED ORDER — EZETIMIBE 10 MG PO TABS: 10.0000 mg | ORAL_TABLET | Freq: Every day | ORAL | 3 refills | Status: AC

## 2024-05-08 NOTE — Patient Instructions (Signed)
 Medication Instructions:   START EZETIMIBE  10 MG ONCE DAILY  METOPROLOL SUCC ER OR TOPROL XL  *If you need a refill on your cardiac medications before your next appointment, please call your pharmacy*  Lab Work:  Your physician recommends that you return for lab work in: 8 [facility]  If you have labs (blood work) drawn today and your tests are completely normal, you will receive your results only by: MyChart Message (if you have MyChart) OR A paper copy in the mail If you have any lab test that is abnormal or we need to change your treatment, we will call you to review the results.  Testing/Procedures:  Your physician has requested that you have an abdominal aorta duplex. During this test, an ultrasound is used to evaluate the aorta. Allow 30 minutes for this exam. Do not eat after midnight the day before and avoid carbonated beverages.  Please note: We ask at that you not bring children with you during ultrasound (echo/ vascular) testing. Due to room size and safety concerns, children are not allowed in the ultrasound rooms during exams. Our front office staff cannot provide observation of children in our lobby area while testing is being conducted. An adult accompanying a patient to their appointment will only be allowed in the ultrasound room at the discretion of the ultrasound technician under special circumstances. We apologize for any inconvenience. [facility]  Follow-Up: At [facility], you and your health needs are our priority.  As part of our continuing mission to provide you with exceptional heart care, our providers are all part of one team.  This team includes your primary Cardiologist (physician) and Advanced Practice Providers or APPs (Physician Assistants and Nurse Practitioners) who all work together to provide you with the care you need, when you need it.  Your next appointment:   6 month(s)  Provider:   Redell Shallow, MD

## 2024-05-10 ENCOUNTER — Other Ambulatory Visit: Payer: Self-pay | Admitting: Cardiology

## 2024-05-10 DIAGNOSIS — R0989 Other specified symptoms and signs involving the circulatory and respiratory systems: Secondary | ICD-10-CM

## 2024-05-10 DIAGNOSIS — Z136 Encounter for screening for cardiovascular disorders: Secondary | ICD-10-CM

## 2024-05-10 DIAGNOSIS — E782 Mixed hyperlipidemia: Secondary | ICD-10-CM

## 2024-05-10 DIAGNOSIS — Z87891 Personal history of nicotine dependence: Secondary | ICD-10-CM

## 2024-05-10 DIAGNOSIS — R9439 Abnormal result of other cardiovascular function study: Secondary | ICD-10-CM

## 2024-05-12 ENCOUNTER — Ambulatory Visit: Payer: Self-pay | Admitting: Cardiology

## 2024-05-12 ENCOUNTER — Ambulatory Visit

## 2024-05-12 DIAGNOSIS — Z136 Encounter for screening for cardiovascular disorders: Secondary | ICD-10-CM

## 2024-06-15 ENCOUNTER — Encounter: Payer: Self-pay | Admitting: Cardiology

## 2024-06-27 ENCOUNTER — Ambulatory Visit: Payer: Self-pay | Admitting: Cardiology

## 2024-06-27 LAB — HEPATIC FUNCTION PANEL
ALT: 31 IU/L (ref 0–44)
AST: 25 IU/L (ref 0–40)
Albumin: 4.4 g/dL (ref 3.9–4.9)
Alkaline Phosphatase: 76 IU/L (ref 47–123)
Bilirubin Total: 0.8 mg/dL (ref 0.0–1.2)
Bilirubin, Direct: 0.2 mg/dL (ref 0.00–0.40)
Total Protein: 6.8 g/dL (ref 6.0–8.5)

## 2024-06-27 LAB — LIPID PANEL
Chol/HDL Ratio: 3.9 ratio (ref 0.0–5.0)
Cholesterol, Total: 153 mg/dL (ref 100–199)
HDL: 39 mg/dL — ABNORMAL LOW (ref >39)
LDL Chol Calc (NIH): 90 mg/dL (ref 0–99)
Triglycerides: 133 mg/dL (ref 0–149)
VLDL Cholesterol Cal: 24 mg/dL (ref 5–40)

## 2024-06-29 ENCOUNTER — Telehealth: Payer: Self-pay | Admitting: *Deleted

## 2024-06-29 NOTE — Telephone Encounter (Signed)
 Left message for pt to call, dr pietro has reviewed the blood pressure log the patient brought by the office. His blood pressure is mildly elevated and dr pietro recommends addition of metoprolol succ er 25 mg once daily at bedtime to help with blood pressure and PVC's, if patient is willing.

## 2024-07-11 NOTE — Telephone Encounter (Signed)
 See mychart messages

## 2024-07-28 MED ORDER — METOPROLOL SUCCINATE ER 25 MG PO TB24: 25.0000 mg | ORAL_TABLET | Freq: Every day | ORAL | 3 refills | Status: AC

## 2025-01-19 ENCOUNTER — Ambulatory Visit: Admitting: Medical-Surgical
